# Patient Record
Sex: Female | Born: 1935 | Race: Black or African American | Hispanic: No | Smoking: Never smoker
Health system: Southern US, Community
[De-identification: ages and names within clinical notes are randomized; demographics above are authoritative.]

## PROBLEM LIST (undated history)

## (undated) DIAGNOSIS — H9190 Unspecified hearing loss, unspecified ear: Secondary | ICD-10-CM

## (undated) DIAGNOSIS — I10 Essential (primary) hypertension: Secondary | ICD-10-CM

## (undated) DIAGNOSIS — E785 Hyperlipidemia, unspecified: Secondary | ICD-10-CM

## (undated) DIAGNOSIS — E119 Type 2 diabetes mellitus without complications: Secondary | ICD-10-CM

## (undated) DIAGNOSIS — F0390 Unspecified dementia without behavioral disturbance: Secondary | ICD-10-CM

## (undated) DIAGNOSIS — M889 Osteitis deformans of unspecified bone: Secondary | ICD-10-CM

## (undated) DIAGNOSIS — I517 Cardiomegaly: Secondary | ICD-10-CM

## (undated) DIAGNOSIS — C50412 Malignant neoplasm of upper-outer quadrant of left female breast: Secondary | ICD-10-CM

## (undated) DIAGNOSIS — K219 Gastro-esophageal reflux disease without esophagitis: Secondary | ICD-10-CM

## (undated) DIAGNOSIS — N189 Chronic kidney disease, unspecified: Secondary | ICD-10-CM

## (undated) DIAGNOSIS — M1990 Unspecified osteoarthritis, unspecified site: Secondary | ICD-10-CM

## (undated) DIAGNOSIS — E079 Disorder of thyroid, unspecified: Secondary | ICD-10-CM

## (undated) HISTORY — DX: Unspecified dementia, unspecified severity, without behavioral disturbance, psychotic disturbance, mood disturbance, and anxiety: F03.90

## (undated) HISTORY — DX: Malignant neoplasm of upper-outer quadrant of left female breast: C50.412

## (undated) HISTORY — DX: Osteitis deformans of unspecified bone: M88.9

## (undated) HISTORY — DX: Gastro-esophageal reflux disease without esophagitis: K21.9

## (undated) HISTORY — DX: Essential (primary) hypertension: I10

## (undated) HISTORY — DX: Unspecified osteoarthritis, unspecified site: M19.90

## (undated) HISTORY — PX: THYROID SURGERY: SHX805

## (undated) HISTORY — DX: Cardiomegaly: I51.7

## (undated) HISTORY — DX: Hyperlipidemia, unspecified: E78.5

## (undated) HISTORY — DX: Unspecified hearing loss, unspecified ear: H91.90

## (undated) HISTORY — DX: Type 2 diabetes mellitus without complications: E11.9

## (undated) HISTORY — DX: Chronic kidney disease, unspecified: N18.9

## (undated) HISTORY — DX: Disorder of thyroid, unspecified: E07.9

## (undated) HISTORY — PX: HERNIA REPAIR: SHX51

---

## 2004-02-02 ENCOUNTER — Ambulatory Visit: Payer: Self-pay | Admitting: Gastroenterology

## 2004-03-05 ENCOUNTER — Ambulatory Visit: Payer: Self-pay | Admitting: Internal Medicine

## 2005-05-20 ENCOUNTER — Ambulatory Visit: Payer: Self-pay | Admitting: Internal Medicine

## 2005-11-06 ENCOUNTER — Ambulatory Visit: Payer: Self-pay | Admitting: Gastroenterology

## 2005-11-13 ENCOUNTER — Ambulatory Visit: Payer: Self-pay | Admitting: Gastroenterology

## 2005-12-03 ENCOUNTER — Other Ambulatory Visit: Payer: Self-pay

## 2005-12-03 ENCOUNTER — Ambulatory Visit: Payer: Self-pay | Admitting: Surgery

## 2005-12-10 ENCOUNTER — Ambulatory Visit: Payer: Self-pay | Admitting: Surgery

## 2006-03-06 ENCOUNTER — Ambulatory Visit: Payer: Self-pay | Admitting: Surgery

## 2006-05-21 ENCOUNTER — Ambulatory Visit: Payer: Self-pay | Admitting: Internal Medicine

## 2007-07-30 ENCOUNTER — Ambulatory Visit: Payer: Self-pay | Admitting: Internal Medicine

## 2007-11-17 ENCOUNTER — Ambulatory Visit: Payer: Self-pay | Admitting: Internal Medicine

## 2008-02-29 ENCOUNTER — Ambulatory Visit: Payer: Self-pay | Admitting: Unknown Physician Specialty

## 2008-08-12 ENCOUNTER — Ambulatory Visit: Payer: Self-pay | Admitting: Internal Medicine

## 2008-11-15 ENCOUNTER — Ambulatory Visit: Payer: Self-pay | Admitting: Internal Medicine

## 2009-02-13 ENCOUNTER — Ambulatory Visit: Payer: Self-pay | Admitting: Gastroenterology

## 2009-08-16 ENCOUNTER — Ambulatory Visit: Payer: Self-pay | Admitting: Internal Medicine

## 2010-09-12 ENCOUNTER — Ambulatory Visit: Payer: Self-pay | Admitting: Internal Medicine

## 2011-09-13 ENCOUNTER — Ambulatory Visit: Payer: Self-pay | Admitting: Internal Medicine

## 2012-06-08 ENCOUNTER — Ambulatory Visit: Payer: Self-pay | Admitting: Gastroenterology

## 2012-06-09 LAB — PATHOLOGY REPORT

## 2012-09-15 ENCOUNTER — Ambulatory Visit: Payer: Self-pay | Admitting: Internal Medicine

## 2013-09-19 DIAGNOSIS — M889 Osteitis deformans of unspecified bone: Secondary | ICD-10-CM | POA: Insufficient documentation

## 2013-09-19 DIAGNOSIS — E1149 Type 2 diabetes mellitus with other diabetic neurological complication: Secondary | ICD-10-CM | POA: Insufficient documentation

## 2013-09-19 DIAGNOSIS — E039 Hypothyroidism, unspecified: Secondary | ICD-10-CM | POA: Insufficient documentation

## 2013-09-19 DIAGNOSIS — K219 Gastro-esophageal reflux disease without esophagitis: Secondary | ICD-10-CM | POA: Insufficient documentation

## 2013-09-19 DIAGNOSIS — I119 Hypertensive heart disease without heart failure: Secondary | ICD-10-CM | POA: Insufficient documentation

## 2013-09-19 DIAGNOSIS — I10 Essential (primary) hypertension: Secondary | ICD-10-CM | POA: Insufficient documentation

## 2013-10-04 ENCOUNTER — Ambulatory Visit: Payer: Self-pay | Admitting: Internal Medicine

## 2014-04-12 DIAGNOSIS — C50412 Malignant neoplasm of upper-outer quadrant of left female breast: Secondary | ICD-10-CM

## 2014-04-12 HISTORY — DX: Malignant neoplasm of upper-outer quadrant of left female breast: C50.412

## 2014-04-29 DIAGNOSIS — E7800 Pure hypercholesterolemia, unspecified: Secondary | ICD-10-CM | POA: Insufficient documentation

## 2014-05-03 ENCOUNTER — Emergency Department: Payer: Self-pay | Admitting: Emergency Medicine

## 2014-05-03 ENCOUNTER — Encounter: Payer: Self-pay | Admitting: General Surgery

## 2014-05-09 ENCOUNTER — Other Ambulatory Visit: Payer: Medicare Other

## 2014-05-09 ENCOUNTER — Encounter: Payer: Self-pay | Admitting: General Surgery

## 2014-05-09 ENCOUNTER — Ambulatory Visit (INDEPENDENT_AMBULATORY_CARE_PROVIDER_SITE_OTHER): Payer: Medicare Other | Admitting: General Surgery

## 2014-05-09 VITALS — BP 145/70 | HR 82 | Resp 14 | Ht 64.0 in | Wt 129.0 lb

## 2014-05-09 DIAGNOSIS — N63 Unspecified lump in breast: Secondary | ICD-10-CM

## 2014-05-09 DIAGNOSIS — N6320 Unspecified lump in the left breast, unspecified quadrant: Secondary | ICD-10-CM | POA: Insufficient documentation

## 2014-05-09 NOTE — Progress Notes (Signed)
Patient ID: Tamara Simon, female   DOB: 1924-10-17, 81 y.o.   MRN: [id number]  Chief Complaint  Patient presents with  . Other    left breast mass    HPI Tamara Simon is a 81 y.o. female here for a mass in her left breast. This was noted in the last month. No change Discovery. No history of trauma. No discomfort. Her last mammogram was 09/25/13, Cat 1. She felt the mass while doing her monthly breast exams. She reports no pain, discomfort, or discharge from the breast.  HPI  Past Medical History  Diagnosis Date  . Diabetes mellitus without complication   . Thyroid disease   . GERD (gastroesophageal reflux disease)   . Arthritis   . Hearing loss   . Chronic kidney disease   . Hypertension   . Left ventricular hypertrophy   . Hyperlipidemia   . Paget's bone disease     Past Surgical History  Procedure Laterality Date  . Hernia repair    . Thyroid surgery  81 years old    Family History  Problem Relation Age of Onset  . Breast cancer Sister     Social History History  Substance Use Topics  . Smoking status: Never Smoker   . Smokeless tobacco: Never Used  . Alcohol Use: 0.0 oz/week    0 Standard drinks or equivalent per week    No Known Allergies  Current Outpatient Prescriptions  Medication Sig Dispense Refill  . amLODipine (NORVASC) 5 MG tablet Take 5 mg by mouth daily.    Marland Kitchen aspirin 81 MG tablet Take 81 mg by mouth daily.    . calcium carbonate (OS-CAL) 600 MG TABS tablet Take 600 mg by mouth 2 (two) times daily with a meal.    . glimepiride (AMARYL) 1 MG tablet Take 1 mg by mouth daily with breakfast.    . labetalol (NORMODYNE) 300 MG tablet Take 300 mg by mouth daily.    Marland Kitchen losartan (COZAAR) 100 MG tablet Take 100 mg by mouth daily.    . memantine (NAMENDA TITRATION PACK) tablet pack Take 10 mg by mouth See admin instructions. 5 mg/day for =1 week; 5 mg twice daily for =1 week; 15 mg/day given in 5 mg and 10 mg separated doses for =1 week; then 10 mg twice  daily    . omeprazole (PRILOSEC) 20 MG capsule Take 20 mg by mouth daily.    . potassium chloride SA (K-DUR,KLOR-CON) 20 MEQ tablet Take 20 mEq by mouth 2 (two) times daily.    Marland Kitchen torsemide (DEMADEX) 20 MG tablet Take 20 mg by mouth daily.     No current facility-administered medications for this visit.    Review of Systems Review of Systems  Constitutional: Negative.   Respiratory: Negative.   Cardiovascular: Negative.     Blood pressure 145/70, pulse 82, resp. rate 14, height 5\' 4"  (1.626 m), weight 129 lb (58.514 kg).  Physical Exam Physical Exam  Constitutional: She is oriented to person, place, and time. She appears well-developed and well-nourished.  Eyes: Conjunctivae are normal. No scleral icterus.  Neck: Neck supple.  Cardiovascular: Normal rate, regular rhythm and normal heart sounds.   Pulmonary/Chest: Effort normal and breath sounds normal. Right breast exhibits no inverted nipple, no mass, no nipple discharge, no skin change and no tenderness. Left breast exhibits mass (2 o'clock 10cm from the nipple a 2 cm mass). Left breast exhibits no inverted nipple, no nipple discharge, no skin change and no tenderness.  Lymphadenopathy:    She has no cervical adenopathy.    She has no axillary adenopathy.  Neurological: She is alert and oriented to person, place, and time.  Skin: Skin is warm and dry.    Data Reviewed Screening mammograms dated 10/04/2013 showed fatty replaced breast. BI-RADS-1.  PCP notes dated 04/29/2014 were reviewed.  Laboratory studies at that time showed an creatinine of 1.2 with an estimated GFR 52. Normal electrolytes. (Stage III kidney disease).  Ultrasound examination of the mass showed an irregularly marginated hypoechoic mass with some posterior acoustic enhancement measuring 1.66 x 1.72 x 1.8 cm. This did not extend to the overlying dermis.BI-RADS-4.  The patient was amenable to core biopsy. Alcohol was applied to the breasts followed by 10  mL of 0.5% Xylocaine with 0.25% Marcaine with 1-200,000 epinephrine. ChloraPrep was applied to the skin and a 14-gauge Bard biopsy device was used to take samples through multiple areas within the mass. No bleeding noted. Years were suggestive of tissue rather than abscess. The skin defect was closed with benzoin and Steri-Strips followed by a Telfa and Tegaderm dressing.  The procedure was well tolerated.    Assessment    Left breast mass.     Plan    Post biopsy instructions were provided. The patient will be contacted when biopsy results have returned.     Ref/PCP Dr Jerilynn Mages. Cephas Darby, Forest Gleason 05/09/2014, 10:38 PM

## 2014-05-09 NOTE — Patient Instructions (Addendum)
CARE AFTER BREAST BIOPSY  1. Leave the dressing on that your doctor applied after surgery. It is waterproof. You may bathe, shower and/or swim. The dressing will probably remain intact until your return office visit. If the dressing comes off, you will see small strips of tape against your skin on the incision. Do not remove these strips.  2. You may want to use a gauze,cloth or similar protection in your bra to prevent rubbing against your dressing and incision. This is not necessary, but you may feel more comfortable doing so.  3. It is recommended that you wear a bra day and night to give support to the breast. This will prevent the weight of the breast from pulling on the incision.  4. Your breast will feel hard and lumpy under the incision. Do not be alarmed. This is the underlying stitching of tissue. Softening of this tissue will occur in time.  5. Make sure you call the office and schedule an appointment in one week after your surgery. The office phone number is [phone]. [facility] may have already done this for you.  6. You will notice about a week after your office visit that the strips of the tape on your incision will begin to loosen. These may then be removed.  7. Report to your doctor any of the following:  * Severe pain not relieved by your pain medication  *Redness of the incision  * Drainage from the incision  *Fever greater than 101 degrees  Follow up in one week

## 2014-05-11 ENCOUNTER — Telehealth: Payer: Self-pay | Admitting: General Surgery

## 2014-05-11 NOTE — Telephone Encounter (Signed)
Patient asked to call back for biopsy results.

## 2014-05-16 ENCOUNTER — Ambulatory Visit (INDEPENDENT_AMBULATORY_CARE_PROVIDER_SITE_OTHER): Payer: Medicare Other | Admitting: *Deleted

## 2014-05-16 DIAGNOSIS — N63 Unspecified lump in breast: Secondary | ICD-10-CM

## 2014-05-16 DIAGNOSIS — N6320 Unspecified lump in the left breast, unspecified quadrant: Secondary | ICD-10-CM

## 2014-05-16 NOTE — Progress Notes (Signed)
Patient here today for follow up post left  breast biopsy. Steristrip in place and aware it may come off in one week.  Minimal bruising noted.  The patient is aware that a heating pad may be used for comfort as needed.  Aware of pathology. Patient to return 05/17/14 to discuss options with MD.

## 2014-05-17 ENCOUNTER — Ambulatory Visit (INDEPENDENT_AMBULATORY_CARE_PROVIDER_SITE_OTHER): Payer: Medicare Other | Admitting: General Surgery

## 2014-05-17 ENCOUNTER — Encounter: Payer: Self-pay | Admitting: General Surgery

## 2014-05-17 VITALS — BP 130/70 | HR 72 | Resp 12 | Ht 64.0 in | Wt 124.0 lb

## 2014-05-17 DIAGNOSIS — N63 Unspecified lump in breast: Secondary | ICD-10-CM | POA: Diagnosis not present

## 2014-05-17 DIAGNOSIS — C50412 Malignant neoplasm of upper-outer quadrant of left female breast: Secondary | ICD-10-CM | POA: Diagnosis not present

## 2014-05-17 DIAGNOSIS — N6320 Unspecified lump in the left breast, unspecified quadrant: Secondary | ICD-10-CM

## 2014-05-17 NOTE — Progress Notes (Signed)
Patient ID: Tamara Simon, female   DOB: Nov 28, 1924, 81 y.o.   MRN: [id number]  Chief Complaint  Patient presents with  . Follow-up    discuss biopsy results    HPI Tamara Simon is a 81 y.o. female here today to discuss biopsy results. She is here today with her niece, Sherian Rein. Her son, Francely Craw, participated by phone. HPI  Past Medical History  Diagnosis Date  . Diabetes mellitus without complication   . Thyroid disease   . GERD (gastroesophageal reflux disease)   . Arthritis   . Hearing loss   . Chronic kidney disease   . Hypertension   . Left ventricular hypertrophy   . Hyperlipidemia   . Paget's bone disease     Past Surgical History  Procedure Laterality Date  . Hernia repair    . Thyroid surgery  81 years old    Family History  Problem Relation Age of Onset  . Breast cancer Sister     Social History History  Substance Use Topics  . Smoking status: Never Smoker   . Smokeless tobacco: Never Used  . Alcohol Use: 0.0 oz/week    0 Standard drinks or equivalent per week    No Known Allergies  Current Outpatient Prescriptions  Medication Sig Dispense Refill  . amLODipine (NORVASC) 5 MG tablet Take 5 mg by mouth daily.    Marland Kitchen aspirin 81 MG tablet Take 81 mg by mouth daily.    . calcium carbonate (OS-CAL) 600 MG TABS tablet Take 600 mg by mouth 2 (two) times daily with a meal.    . glimepiride (AMARYL) 1 MG tablet Take 1 mg by mouth daily with breakfast.    . labetalol (NORMODYNE) 300 MG tablet Take 300 mg by mouth daily.    Marland Kitchen losartan (COZAAR) 100 MG tablet Take 100 mg by mouth daily.    . memantine (NAMENDA TITRATION PACK) tablet pack Take 10 mg by mouth See admin instructions. 5 mg/day for =1 week; 5 mg twice daily for =1 week; 15 mg/day given in 5 mg and 10 mg separated doses for =1 week; then 10 mg twice daily    . omeprazole (PRILOSEC) 20 MG capsule Take 20 mg by mouth daily.    . potassium chloride SA (K-DUR,KLOR-CON) 20 MEQ tablet Take 20  mEq by mouth 2 (two) times daily.    Marland Kitchen torsemide (DEMADEX) 20 MG tablet Take 20 mg by mouth daily.     No current facility-administered medications for this visit.    Review of Systems Review of Systems  Constitutional: Negative.   Respiratory: Negative.   Cardiovascular: Negative.     Blood pressure 130/70, pulse 72, resp. rate 12, height 5\' 4"  (1.626 m), weight 124 lb (56.246 kg).  Physical Exam Physical Exam CMA reports minimal bruising at the biopsy site.  Data Reviewed Triple negative left breast cancer.  Assessment    Recently noted left breast cancer, previously negative mammograms August 2015.    Plan     Options for management of the cancer were reviewed with the patient, her niece and her son (by phone). This fairly sizable mass developed relatively quickly, and with a triple negative histology has an unfavorable prognostic course. The patient's sister had breast cancer over 15 years ago and did well after mastectomy. Breast conservation with radiation as well as mastectomy were presented as equivalent procedures. There is no clinical evidence of metastatic disease to the axilla. The patient was amenable to having laboratory studies  obtained.  She'll consider her options for management notify the office of how she would like to proceed.  Robert Bellow 05/18/2014, 7:45 AM

## 2014-05-18 ENCOUNTER — Encounter: Payer: Self-pay | Admitting: General Surgery

## 2014-05-18 ENCOUNTER — Telehealth: Payer: Self-pay | Admitting: *Deleted

## 2014-05-18 DIAGNOSIS — C50412 Malignant neoplasm of upper-outer quadrant of left female breast: Secondary | ICD-10-CM | POA: Insufficient documentation

## 2014-05-18 LAB — CBC WITH DIFFERENTIAL/PLATELET
BASOS ABS: 0.1 10*3/uL (ref 0.0–0.2)
Basos: 1 %
Eos: 2 %
Eosinophils Absolute: 0.1 10*3/uL (ref 0.0–0.4)
HCT: 33.7 % — ABNORMAL LOW (ref 34.0–46.6)
Hemoglobin: 11.4 g/dL (ref 11.1–15.9)
IMMATURE GRANULOCYTES: 0 %
Immature Grans (Abs): 0 10*3/uL (ref 0.0–0.1)
LYMPHS: 21 %
Lymphocytes Absolute: 1 10*3/uL (ref 0.7–3.1)
MCH: 27.8 pg (ref 26.6–33.0)
MCHC: 33.8 g/dL (ref 31.5–35.7)
MCV: 82 fL (ref 79–97)
MONOCYTES: 6 %
Monocytes Absolute: 0.3 10*3/uL (ref 0.1–0.9)
Neutrophils Absolute: 3.5 10*3/uL (ref 1.4–7.0)
Neutrophils Relative %: 70 %
PLATELETS: 297 10*3/uL (ref 150–379)
RBC: 4.1 x10E6/uL (ref 3.77–5.28)
RDW: 13.4 % (ref 12.3–15.4)
WBC: 5 10*3/uL (ref 3.4–10.8)

## 2014-05-18 LAB — CANCER ANTIGEN 27.29: CA 27.29: 28.3 U/mL (ref 0.0–38.6)

## 2014-05-18 LAB — CEA: CEA: 3.3 ng/mL (ref 0.0–4.7)

## 2014-05-18 NOTE — Telephone Encounter (Signed)
Notified patient as instructed, patient pleased. Discussed follow-up appointments will be pending the status of her sister (end of life care), patient agrees.

## 2014-05-18 NOTE — Telephone Encounter (Signed)
-----   Message from Robert Bellow, MD sent at 05/18/2014  8:08 AM EDT ----- Please notify the patient her blood work was normal. See if she has any questions about decisions regarding management of her breast cancer. Thank you ----- Message -----    From: [facility] Lab Results In Interface    Sent: 05/18/2014   7:44 AM      To: Robert Bellow, MD

## 2014-05-25 ENCOUNTER — Ambulatory Visit (INDEPENDENT_AMBULATORY_CARE_PROVIDER_SITE_OTHER): Payer: Medicare Other | Admitting: General Surgery

## 2014-05-25 ENCOUNTER — Encounter: Payer: Self-pay | Admitting: General Surgery

## 2014-05-25 VITALS — BP 134/68 | HR 62 | Resp 12 | Ht 64.0 in | Wt 126.0 lb

## 2014-05-25 DIAGNOSIS — C50412 Malignant neoplasm of upper-outer quadrant of left female breast: Secondary | ICD-10-CM | POA: Diagnosis not present

## 2014-05-25 NOTE — Patient Instructions (Addendum)
The patient is aware to call back for any questions or concerns.  Patient to notify our office when she wishes to proceed with left mastectomy.

## 2014-05-25 NOTE — Progress Notes (Signed)
Patient ID: Tamara Simon, female   DOB: 10-22-1924, 81 y.o.   MRN: [id number]  Chief Complaint  Patient presents with  . Follow-up    discussion    HPI Tamara Simon is a 81 y.o. female.  Here today for discussion with her son, Tamara Simon.  Mr. Coltrain had been on a phone interview with the patient and the patient's niece last week to review options for management of her mother's recently noted breast cancer. He came in from [place], [place] for in person interview and to help assist his mother with her decision making.   HPI  Past Medical History  Diagnosis Date  . Diabetes mellitus without complication   . Thyroid disease   . GERD (gastroesophageal reflux disease)   . Arthritis   . Hearing loss   . Chronic kidney disease   . Hypertension   . Left ventricular hypertrophy   . Hyperlipidemia   . Paget's bone disease   . Breast cancer of upper-outer quadrant of left female breast March 2016    Triple negative    Past Surgical History  Procedure Laterality Date  . Hernia repair    . Thyroid surgery  81 years old    Family History  Problem Relation Age of Onset  . Breast cancer Sister     Social History History  Substance Use Topics  . Smoking status: Never Smoker   . Smokeless tobacco: Never Used  . Alcohol Use: 0.0 oz/week    0 Standard drinks or equivalent per week    No Known Allergies  Current Outpatient Prescriptions  Medication Sig Dispense Refill  . amLODipine (NORVASC) 5 MG tablet Take 5 mg by mouth daily.    Marland Kitchen aspirin 81 MG tablet Take 81 mg by mouth daily.    . calcium carbonate (OS-CAL) 600 MG TABS tablet Take 600 mg by mouth 2 (two) times daily with a meal.    . glimepiride (AMARYL) 1 MG tablet Take 1 mg by mouth daily with breakfast.    . labetalol (NORMODYNE) 300 MG tablet Take 300 mg by mouth daily.    Marland Kitchen losartan (COZAAR) 100 MG tablet Take 100 mg by mouth daily.    . memantine (NAMENDA TITRATION PACK) tablet pack Take 10 mg by mouth  See admin instructions. 5 mg/day for =1 week; 5 mg twice daily for =1 week; 15 mg/day given in 5 mg and 10 mg separated doses for =1 week; then 10 mg twice daily    . omeprazole (PRILOSEC) 20 MG capsule Take 20 mg by mouth daily.    . potassium chloride SA (K-DUR,KLOR-CON) 20 MEQ tablet Take 20 mEq by mouth 2 (two) times daily.    Marland Kitchen torsemide (DEMADEX) 20 MG tablet Take 20 mg by mouth daily.     No current facility-administered medications for this visit.    Review of Systems Review of Systems  Blood pressure 134/68, pulse 62, resp. rate 12, height 5\' 4"  (1.626 m), weight 126 lb (57.153 kg).  Physical Exam Physical Exam  Pulmonary/Chest:  Biopsy site healing well, mild irritation from dressing.    Data Reviewed Triple negative cancer of the left breast. Negative mammogram 6 months ago.  Assessment    Left breast cancer     Plan    I think the patient will be well served by surgical removal of the tumor. We discussed the pros and cons of breast conservation followed by radiation or mastectomy. She really seemed to have no interest in  breast conservation. Reconstruction was not offered based on her age and the prolonged anesthesia required to complete this.  Her general health is good, and her sister lived almost 41 years after her own mastectomy. This sister recently passed after episode of what sounds like pneumonia complicated by multisystem organ failure. This may have been impairing the patient's ability to make a clear decision regarding her own care.  Patient to notify our office when she wishes to proceed with left mastectomy.     PCP:  Irish Elders 05/28/2014, 11:04 AM

## 2014-07-08 HISTORY — PX: OTHER SURGICAL HISTORY: SHX169

## 2015-05-01 ENCOUNTER — Encounter: Payer: Self-pay | Admitting: *Deleted

## 2016-02-01 ENCOUNTER — Other Ambulatory Visit: Payer: Self-pay | Admitting: Internal Medicine

## 2016-02-01 DIAGNOSIS — E1140 Type 2 diabetes mellitus with diabetic neuropathy, unspecified: Secondary | ICD-10-CM

## 2016-02-01 DIAGNOSIS — E039 Hypothyroidism, unspecified: Secondary | ICD-10-CM

## 2016-02-01 DIAGNOSIS — I10 Essential (primary) hypertension: Secondary | ICD-10-CM

## 2016-02-01 DIAGNOSIS — R634 Abnormal weight loss: Secondary | ICD-10-CM

## 2016-02-15 ENCOUNTER — Ambulatory Visit: Admission: RE | Admit: 2016-02-15 | Payer: Self-pay | Source: Ambulatory Visit

## 2016-02-20 ENCOUNTER — Ambulatory Visit: Payer: Self-pay

## 2016-02-26 ENCOUNTER — Ambulatory Visit: Payer: Self-pay | Admitting: Oncology

## 2016-02-27 ENCOUNTER — Ambulatory Visit
Admission: RE | Admit: 2016-02-27 | Discharge: 2016-02-27 | Disposition: A | Discharge status: Home / Self Care | Payer: Medicare Other | Source: Ambulatory Visit | Attending: Internal Medicine | Admitting: Internal Medicine

## 2016-02-27 DIAGNOSIS — Z9049 Acquired absence of other specified parts of digestive tract: Secondary | ICD-10-CM | POA: Diagnosis not present

## 2016-02-27 DIAGNOSIS — E1140 Type 2 diabetes mellitus with diabetic neuropathy, unspecified: Secondary | ICD-10-CM | POA: Diagnosis present

## 2016-02-27 DIAGNOSIS — I2510 Atherosclerotic heart disease of native coronary artery without angina pectoris: Secondary | ICD-10-CM | POA: Insufficient documentation

## 2016-02-27 DIAGNOSIS — M88851 Osteitis deformans of right thigh: Secondary | ICD-10-CM | POA: Diagnosis not present

## 2016-02-27 DIAGNOSIS — I70298 Other atherosclerosis of native arteries of extremities, other extremity: Secondary | ICD-10-CM | POA: Diagnosis not present

## 2016-02-27 DIAGNOSIS — I10 Essential (primary) hypertension: Secondary | ICD-10-CM | POA: Insufficient documentation

## 2016-02-27 DIAGNOSIS — M47896 Other spondylosis, lumbar region: Secondary | ICD-10-CM | POA: Insufficient documentation

## 2016-02-27 DIAGNOSIS — R634 Abnormal weight loss: Secondary | ICD-10-CM | POA: Diagnosis not present

## 2016-02-27 DIAGNOSIS — E039 Hypothyroidism, unspecified: Secondary | ICD-10-CM | POA: Diagnosis present

## 2016-02-27 DIAGNOSIS — N281 Cyst of kidney, acquired: Secondary | ICD-10-CM | POA: Diagnosis not present

## 2016-02-27 DIAGNOSIS — K7689 Other specified diseases of liver: Secondary | ICD-10-CM | POA: Insufficient documentation

## 2016-02-27 MED ORDER — IOPAMIDOL (ISOVUE-300) INJECTION 61%
80.0000 mL | Freq: Once | INTRAVENOUS | Status: AC | PRN
  Administered 2016-02-27: 80 mL via INTRAVENOUS

## 2016-03-19 NOTE — Progress Notes (Addendum)
Hematology/On[facility] Telephone:([phone] [phone]  Patient Care Team: Kirk Ruths, MD as PCP - General (Internal Medicine) Kirk Ruths, MD (Internal Medicine) Robert Bellow, MD (General Surgery)   Name of the patient: Tamara Simon  [id number]  1924-06-27    Reason for referral- h/o breast cancer   Referring physician- Dr. Ginette Pitman  Date of visit: 03/19/16   History of presenting illness- patient is a 83 year old female who was diagnosed with triple negative left breast cancer in March 2016. Her oncology history is as follows:  04/2014: She self-palpated a left breast mass while bathing.  05/09/14: Ultrasound examination of left breast mass showed an irregularly marginated hypoechoic mass with some posterior acoustic enhancement measuring 1.66 x 1.72 x 1.80 cm. This did not extend to the overlying dermis. BI-RADS-4.   Left breast core biopsy of this mass confirmed grade 3 invasive ductal carcinoma, measuring 0.7 cm, with no DCIS. Receptor status ER/PR/HER2 negative.  06/16/14: Additional imaging at [facility] showed no abnormalities in the contralateral right breast, and no left axillary lymphadenopathy. The index lesion measured 2.5 x 3 cm in the left breast upper outer quadrant.  07/08/14: Left partial mastectomy: 2.5 cm, grade 3, LVI positive, lymph node not evaluated, clear margin invasive ductal carcinoma.  09/14/14: Completed adjuvant radiation therapy (Canadian fractionation plus electron boost), 4272 cGy + 1000 cGy   Patient did not get any adjuvant chemotherapy due to her age anf frailty   Patient is here with her niece today who is very involved in her care. She has dementia and cannot explain things in detail. She denies any pain or discomfort today  ECOG PS- 2  Pain scale- 0   Review of systems- Review of Systems  Constitutional: Negative for chills, fever, malaise/fatigue and weight loss.    HENT: Negative for congestion, ear discharge and nosebleeds.   Eyes: Negative for blurred vision.  Respiratory: Negative for cough, hemoptysis, sputum production, shortness of breath and wheezing.   Cardiovascular: Negative for chest pain, palpitations, orthopnea and claudication.  Gastrointestinal: Negative for abdominal pain, blood in stool, constipation, diarrhea, heartburn, melena, nausea and vomiting.  Genitourinary: Negative for dysuria, flank pain, frequency, hematuria and urgency.  Musculoskeletal: Negative for back pain, joint pain and myalgias.  Skin: Negative for rash.  Neurological: Negative for dizziness, tingling, focal weakness, seizures, weakness and headaches.  Endo/Heme/Allergies: Does not bruise/bleed easily.  Psychiatric/Behavioral: Negative for depression and suicidal ideas. The patient does not have insomnia.     No Known Allergies  Patient Active Problem List   Diagnosis Date Noted  . Breast cancer of upper-outer quadrant of left female breast ([place]) 05/18/2014     Past Medical History:  Diagnosis Date  . Arthritis   . Breast cancer of upper-outer quadrant of left female breast [facility]) March 2016   Triple negative  . Chronic kidney disease   . Dementia   . Diabetes mellitus without complication ([place])   . GERD (gastroesophageal reflux disease)   . Hearing loss   . Hyperlipidemia   . Hypertension   . Left ventricular hypertrophy   . Paget's bone disease   . Thyroid disease      Past Surgical History:  Procedure Laterality Date  . HERNIA REPAIR    . partial mastectomy left breast  07/08/2014  . THYROID SURGERY  83 years old    Social History   Social History  . Marital status: Widowed    Spouse name: N/A  .  Number of children: N/A  . Years of education: N/A   Occupational History  . Not on file.   Social History Main Topics  . Smoking status: Never Smoker  . Smokeless tobacco: Never Used  . Alcohol use 0.0 oz/week  . Drug use: No  .  Sexual activity: Not on file   Other Topics Concern  . Not on file   Social History Narrative  . No narrative on file     Family History  Problem Relation Age of Onset  . Breast cancer Sister      Current Outpatient Prescriptions:  .  aspirin 81 MG tablet, Take 81 mg by mouth daily., Disp: , Rfl:  .  calcium carbonate (OS-CAL) 600 MG TABS tablet, Take 600 mg by mouth 2 (two) times daily with a meal., Disp: , Rfl:  .  docusate sodium (COLACE) 100 MG capsule, Take by mouth., Disp: , Rfl:  .  glimepiride (AMARYL) 1 MG tablet, Take 1 mg by mouth daily with breakfast., Disp: , Rfl:  .  iron polysaccharides (NIFEREX) 150 MG capsule, Take by mouth., Disp: , Rfl:  .  labetalol (NORMODYNE) 300 MG tablet, Take 300 mg by mouth daily., Disp: , Rfl:  .  levothyroxine (SYNTHROID, LEVOTHROID) 125 MCG tablet, Take by mouth., Disp: , Rfl:  .  losartan (COZAAR) 25 MG tablet, Take by mouth., Disp: , Rfl:  .  memantine (NAMENDA TITRATION PACK) tablet pack, Take 10 mg by mouth See admin instructions. 5 mg/day for =1 week; 5 mg twice daily for =1 week; 15 mg/day given in 5 mg and 10 mg separated doses for =1 week; then 10 mg twice daily, Disp: , Rfl:  .  potassium chloride SA (K-DUR,KLOR-CON) 20 MEQ tablet, Take 20 mEq by mouth 2 (two) times daily., Disp: , Rfl:  .  torsemide (DEMADEX) 20 MG tablet, Take 20 mg by mouth daily., Disp: , Rfl:  .  amLODipine (NORVASC) 5 MG tablet, Take 5 mg by mouth daily., Disp: , Rfl:  .  omeprazole (PRILOSEC) 20 MG capsule, Take 20 mg by mouth daily., Disp: , Rfl:  .  traMADol (ULTRAM) 50 MG tablet, Take 50 mg by mouth., Disp: , Rfl:    Physical exam:  Vitals:   03/21/16 1042  BP: (!) 193/64  Pulse: 81  Resp: 18  Temp: (!) 96.5 F (35.8 C)  TempSrc: Tympanic  Weight: 120 lb 9.5 oz (54.7 kg)  Height: 5' 1.81" (1.57 m)   Physical Exam  Constitutional: She is oriented to person, place, and time and well-developed, well-nourished, and in no distress.  HENT:    Head: Normocephalic and atraumatic.  Eyes: EOM are normal. Pupils are equal, round, and reactive to light.  Neck: Normal range of motion.  Cardiovascular: Normal rate, regular rhythm and normal heart sounds.   Pulmonary/Chest: Effort normal and breath sounds normal.  Abdominal: Soft. Bowel sounds are normal.  Neurological: She is alert and oriented to person, place, and time.  Skin: Skin is warm and dry.   Breast exam was performed in seated and lying down position. Patient is status post left lumpectomy with a well-healed surgical scar. No evidence of any palpable masses. No evidence of axillary adenopathy. No evidence of any palpable masses or lumps in the right breast. No evidence of right axillary adenopathy   No flowsheet data found. CBC Latest Ref Rng & Units 05/17/2014  WBC 3.4 - 10.8 x10E3/uL 5.0  Hemoglobin 11.1 - 15.9 g/dL 11.4  Hematocrit 34.0 -  46.6 % 33.7(L)  Platelets 150 - 379 x10E3/uL 297    No images are attached to the encounter.  Ct Abdomen Pelvis W Contrast  Result Date: 02/27/2016 CLINICAL DATA:  Chronic lower abdominal discomfort. EXAM: CT ABDOMEN AND PELVIS WITH CONTRAST TECHNIQUE: Multidetector CT imaging of the abdomen and pelvis was performed using the standard protocol following bolus administration of intravenous contrast. CONTRAST:  80 ml ISOVUE-300 IOPAMIDOL (ISOVUE-300) INJECTION 61% COMPARISON:  CT abdomen and pelvis 02/29/2008. FINDINGS: Lower chest: Small amount of contrast is seen in the distal esophagus consistent with either poor motility or reflux. Heart size is upper normal. No pericardial or pleural effusion. Lung bases are clear. Hepatobiliary: Status post cholecystectomy. 1 cm cyst in the inferior right hepatic lobe is unchanged. The liver otherwise appears normal. Biliary tree is unremarkable. Pancreas: Unremarkable. No pancreatic ductal dilatation or surrounding inflammatory changes. Spleen: Normal in size without focal abnormality. Adrenals/Urinary  Tract: 2 small right renal cysts are unchanged. The kidneys otherwise appear normal. Urinary bladder is unremarkable. The adrenal glands appear normal. Stomach/Bowel: Prominent colonic stool burden is noted. The colon is otherwise unremarkable. The stomach, small bowel and appendix appear normal. Vascular/Lymphatic: Scattered aortoiliac atherosclerosis without aneurysm is seen. No lymphadenopathy. Reproductive: Uterus and bilateral adnexa are unremarkable. Other: No abdominal wall hernia or abnormality. No abdominopelvic ascites. Musculoskeletal: Cortical thickening in the sacrum and right ilium is unchanged and most consistent with Paget's disease. Lower lumbar spondylosis is noted. IMPRESSION: No acute abnormality or finding to explain patient's symptoms. Small volume of contrast in the distal esophagus could be due to reflux or poor motility. Atherosclerosis. Prominent colonic stool burden. Paget's disease right sacrum and right ilium, stable in appearance. Electronically Signed   By: Inge Rise M.D.   On: 02/27/2016 09:24    Assessment and plan- Patient is a 83 y.o. female with a history of stage II triple negative left breast cancer diagnosed in March 2016 status post lumpectomy and adjuvant radiation. She currently remains on clinical surveillance  1. Patient has dementia and cannot understand the implications of her breast cancer diagnosis and management options. I did explain to the niece that the risk for recurrence for triple negative breast cancer is highest in the 1st 5 years and goes down subsequently. Patient is 83 yrs old and is frail with dementia. Her family needs to decide if they would like to continue pursue screening mammograms at her age understanding that mammograms can pick up early breast lesions but would be associated with further procedures such as biospies and possible surgical interventions. The alternative would be to forego mammograms and continue physical exam and breast  exam every 6 months and consider intervention if palpable mass is noted on breast exam. Patients niece would like to think about this and discuss this further with Med Onc at [facility] and get back to [place]  2. HTN- on repeat check SBP was 188. Patient has not taken her AM BP meds which she will after she goes home. We have also contacted herPCP office and informed them of the findings  I will see her back in 6 months and schedule mammogram if family decides to pursue it   Total face to face encounter time for this patient visit was 30 min. >50% of the time was  spent in counseling and coordination of care.     Thank you for this kind referral and the opportunity to participate in the care of this patient   Visit Diagnosis 1. Malignant neoplasm of  upper-outer quadrant of left breast in female, estrogen receptor negative ([place])   2. Essential hypertension     Dr. Randa Evens, MD, MPH [facility] at [facility] Pager- [phone] 03/19/2016  2:04 PM

## 2016-03-21 ENCOUNTER — Inpatient Hospital Stay: Payer: Medicare Other | Attending: Oncology | Admitting: Oncology

## 2016-03-21 ENCOUNTER — Encounter: Payer: Self-pay | Admitting: Oncology

## 2016-03-21 VITALS — BP 188/66 | HR 66 | Temp 96.5°F | Resp 18 | Ht 61.81 in | Wt 120.6 lb

## 2016-03-21 DIAGNOSIS — Z923 Personal history of irradiation: Secondary | ICD-10-CM | POA: Diagnosis not present

## 2016-03-21 DIAGNOSIS — K219 Gastro-esophageal reflux disease without esophagitis: Secondary | ICD-10-CM | POA: Diagnosis not present

## 2016-03-21 DIAGNOSIS — E079 Disorder of thyroid, unspecified: Secondary | ICD-10-CM | POA: Insufficient documentation

## 2016-03-21 DIAGNOSIS — Z171 Estrogen receptor negative status [ER-]: Secondary | ICD-10-CM | POA: Diagnosis not present

## 2016-03-21 DIAGNOSIS — R54 Age-related physical debility: Secondary | ICD-10-CM | POA: Diagnosis not present

## 2016-03-21 DIAGNOSIS — E1122 Type 2 diabetes mellitus with diabetic chronic kidney disease: Secondary | ICD-10-CM | POA: Diagnosis not present

## 2016-03-21 DIAGNOSIS — C50412 Malignant neoplasm of upper-outer quadrant of left female breast: Secondary | ICD-10-CM

## 2016-03-21 DIAGNOSIS — Z7982 Long term (current) use of aspirin: Secondary | ICD-10-CM | POA: Insufficient documentation

## 2016-03-21 DIAGNOSIS — N189 Chronic kidney disease, unspecified: Secondary | ICD-10-CM | POA: Insufficient documentation

## 2016-03-21 DIAGNOSIS — I129 Hypertensive chronic kidney disease with stage 1 through stage 4 chronic kidney disease, or unspecified chronic kidney disease: Secondary | ICD-10-CM | POA: Insufficient documentation

## 2016-03-21 DIAGNOSIS — Z853 Personal history of malignant neoplasm of breast: Secondary | ICD-10-CM | POA: Diagnosis not present

## 2016-03-21 DIAGNOSIS — F0390 Unspecified dementia without behavioral disturbance: Secondary | ICD-10-CM

## 2016-03-21 DIAGNOSIS — Z7984 Long term (current) use of oral hypoglycemic drugs: Secondary | ICD-10-CM | POA: Insufficient documentation

## 2016-03-21 DIAGNOSIS — E785 Hyperlipidemia, unspecified: Secondary | ICD-10-CM | POA: Insufficient documentation

## 2016-03-21 DIAGNOSIS — Z79899 Other long term (current) drug therapy: Secondary | ICD-10-CM | POA: Diagnosis not present

## 2016-03-21 DIAGNOSIS — I10 Essential (primary) hypertension: Secondary | ICD-10-CM

## 2016-03-21 NOTE — Progress Notes (Signed)
Pt  Here for initial l visit . Transferred form [facility]./  pcp at [place]. Family friend Kevan Ny to verify  Cozaar dosages and needs to pick up med today the patient is [facility], Was dx w Dementia per Jefm Bryant notes x 2 yrs she believes . This Probation officer called pharmacy and per Mickel Baas pharmacist pt is now on Cozaar 25mg  po qd. Med list updated 1136 am. Repeat bp taken-188/64,p-66. Per care giver Su Hilt pt had not taken antihypertensive meds this am -encouraged to do so when they get home-acknowledged understanding Vella Raring RN  1154 am. Call to Dr Ginette Pitman office (pcp) [phone] and message left w initial receptionist who stated she would send this message to Dr Ginette Pitman.

## 2016-04-01 ENCOUNTER — Telehealth: Payer: Self-pay | Admitting: *Deleted

## 2016-07-22 ENCOUNTER — Emergency Department: Payer: Medicare Other

## 2016-07-22 ENCOUNTER — Encounter: Payer: Self-pay | Admitting: Emergency Medicine

## 2016-07-22 ENCOUNTER — Emergency Department
Admission: EM | Admit: 2016-07-22 | Discharge: 2016-07-22 | Disposition: A | Discharge status: Skilled Nursing Facility | Payer: Medicare Other | Attending: Emergency Medicine | Admitting: Emergency Medicine

## 2016-07-22 DIAGNOSIS — Z853 Personal history of malignant neoplasm of breast: Secondary | ICD-10-CM | POA: Diagnosis not present

## 2016-07-22 DIAGNOSIS — N189 Chronic kidney disease, unspecified: Secondary | ICD-10-CM | POA: Diagnosis not present

## 2016-07-22 DIAGNOSIS — Z79899 Other long term (current) drug therapy: Secondary | ICD-10-CM | POA: Insufficient documentation

## 2016-07-22 DIAGNOSIS — Z7984 Long term (current) use of oral hypoglycemic drugs: Secondary | ICD-10-CM | POA: Diagnosis not present

## 2016-07-22 DIAGNOSIS — F0390 Unspecified dementia without behavioral disturbance: Secondary | ICD-10-CM | POA: Insufficient documentation

## 2016-07-22 DIAGNOSIS — Z7982 Long term (current) use of aspirin: Secondary | ICD-10-CM | POA: Insufficient documentation

## 2016-07-22 DIAGNOSIS — E119 Type 2 diabetes mellitus without complications: Secondary | ICD-10-CM | POA: Insufficient documentation

## 2016-07-22 DIAGNOSIS — W19XXXA Unspecified fall, initial encounter: Secondary | ICD-10-CM

## 2016-07-22 DIAGNOSIS — Y92129 Unspecified place in nursing home as the place of occurrence of the external cause: Secondary | ICD-10-CM | POA: Diagnosis not present

## 2016-07-22 DIAGNOSIS — Y998 Other external cause status: Secondary | ICD-10-CM | POA: Insufficient documentation

## 2016-07-22 DIAGNOSIS — T1490XA Injury, unspecified, initial encounter: Secondary | ICD-10-CM | POA: Diagnosis present

## 2016-07-22 DIAGNOSIS — W010XXA Fall on same level from slipping, tripping and stumbling without subsequent striking against object, initial encounter: Secondary | ICD-10-CM | POA: Diagnosis not present

## 2016-07-22 DIAGNOSIS — I129 Hypertensive chronic kidney disease with stage 1 through stage 4 chronic kidney disease, or unspecified chronic kidney disease: Secondary | ICD-10-CM | POA: Insufficient documentation

## 2016-07-22 DIAGNOSIS — Y9389 Activity, other specified: Secondary | ICD-10-CM | POA: Insufficient documentation

## 2016-07-22 LAB — URINALYSIS, COMPLETE (UACMP) WITH MICROSCOPIC
BILIRUBIN URINE: NEGATIVE
Bacteria, UA: NONE SEEN
GLUCOSE, UA: NEGATIVE mg/dL
Ketones, ur: NEGATIVE mg/dL
Leukocytes, UA: NEGATIVE
NITRITE: NEGATIVE
PROTEIN: 30 mg/dL — AB
SPECIFIC GRAVITY, URINE: 1.006 (ref 1.005–1.030)
pH: 6 (ref 5.0–8.0)

## 2016-07-22 LAB — BASIC METABOLIC PANEL
ANION GAP: 7 (ref 5–15)
BUN: 29 mg/dL — ABNORMAL HIGH (ref 6–20)
CALCIUM: 9 mg/dL (ref 8.9–10.3)
CO2: 27 mmol/L (ref 22–32)
Chloride: 103 mmol/L (ref 101–111)
Creatinine, Ser: 1.03 mg/dL — ABNORMAL HIGH (ref 0.44–1.00)
GFR, EST AFRICAN AMERICAN: 54 mL/min — AB (ref >60)
GFR, EST NON AFRICAN AMERICAN: 46 mL/min — AB (ref >60)
GLUCOSE: 103 mg/dL — AB (ref 65–99)
POTASSIUM: 3.4 mmol/L — AB (ref 3.5–5.1)
SODIUM: 137 mmol/L (ref 135–145)

## 2016-07-22 LAB — CBC
HCT: 33.8 % — ABNORMAL LOW (ref 35.0–47.0)
Hemoglobin: 11.5 g/dL — ABNORMAL LOW (ref 12.0–16.0)
MCH: 27.7 pg (ref 26.0–34.0)
MCHC: 34.2 g/dL (ref 32.0–36.0)
MCV: 81.1 fL (ref 80.0–100.0)
Platelets: 178 10*3/uL (ref 150–440)
RBC: 4.16 MIL/uL (ref 3.80–5.20)
RDW: 14.7 % — ABNORMAL HIGH (ref 11.5–14.5)
WBC: 7.8 10*3/uL (ref 3.6–11.0)

## 2016-07-22 LAB — TROPONIN I
TROPONIN I: 0.04 ng/mL — AB (ref <0.03)
Troponin I: 0.03 ng/mL (ref <0.03)

## 2016-07-22 NOTE — ED Notes (Signed)
Pt able to ambulate to restroom with one assist. Pt is steady on feet but reports having become tired after walking for extended amount of time.

## 2016-07-22 NOTE — ED Triage Notes (Signed)
Pt here from [facility], pt had witness fall at 0800. Pt ambulatory after fall. Denies hitting head, denies LOC. EMS reports normal ambulation upon their arrival.

## 2016-07-22 NOTE — ED Provider Notes (Signed)
[facility] Emergency Department Provider Note  ____________________________________________   First MD Initiated Contact with Patient 07/22/16 1144     (approximate)  I have reviewed the triage vital signs and the nursing notes.   HISTORY  Chief Complaint Fall  Level V exemption history Limited by the patient's dementia  HPI Tamara Simon is a 83 y.o. female who sent to the emergency department via EMS after a fall at home. She says she's not sure why she fell and she didn't want to come to the hospital and that she feels "pretty good. She is in no pain whatsoever. She denies chest pain shortness of breath abdominal pain nausea vomiting palpitations blurred vision double vision.   Past Medical History:  Diagnosis Date  . Arthritis   . Breast cancer of upper-outer quadrant of left female breast [facility]) March 2016   Triple negative  . Chronic kidney disease   . Dementia   . Diabetes mellitus without complication ([place])   . GERD (gastroesophageal reflux disease)   . Hearing loss   . Hyperlipidemia   . Hypertension   . Left ventricular hypertrophy   . Paget's bone disease   . Thyroid disease     Patient Active Problem List   Diagnosis Date Noted  . Breast cancer of upper-outer quadrant of left female breast ([place]) 05/18/2014    Past Surgical History:  Procedure Laterality Date  . HERNIA REPAIR    . partial mastectomy left breast  07/08/2014  . THYROID SURGERY  83 years old    Prior to Admission medications   Medication Sig Start Date End Date Taking? Authorizing Provider  amLODipine (NORVASC) 5 MG tablet Take 5 mg by mouth daily. 04/16/14  Yes [provider]  aspirin 81 MG tablet Take 81 mg by mouth daily.   Yes [provider]  docusate sodium (COLACE) 100 MG capsule Take 100 mg by mouth 2 (two) times daily.  07/01/14  Yes [provider]  glimepiride (AMARYL) 1 MG tablet Take 1 mg by mouth daily with breakfast.    Yes [provider]  iron polysaccharides (NIFEREX) 150 MG capsule Take 150 mg by mouth daily.  11/02/15 11/01/16 Yes [provider]  levothyroxine (SYNTHROID, LEVOTHROID) 125 MCG tablet Take 125 mcg by mouth daily before breakfast.  03/09/14  Yes [provider]  loratadine (CLARITIN) 10 MG tablet Take 10 mg by mouth daily as needed for allergies.   Yes [provider]  losartan-hydrochlorothiazide (HYZAAR) 50-12.5 MG tablet Take 1 tablet by mouth daily. 06/12/16 06/12/17 Yes [provider]  memantine (NAMENDA) 5 MG tablet Take 5 mg by mouth 2 (two) times daily.    Yes [provider]  polyvinyl alcohol (LIQUIFILM TEARS) 1.4 % ophthalmic solution Place 1 drop into the left eye 4 (four) times daily as needed for dry eyes.   Yes [provider]  traMADol (ULTRAM) 50 MG tablet Take 50 mg by mouth daily as needed for moderate pain.  07/01/14  Yes [provider]    Allergies Patient has no known allergies.  Family History  Problem Relation Age of Onset  . Breast cancer Sister     Social History Social History  Substance Use Topics  . Smoking status: Never Smoker  . Smokeless tobacco: Never Used  . Alcohol use 0.0 oz/week    Review of Systems Level V exemption history Limited by the patient's dementia  ____________________________________________   PHYSICAL EXAM:  VITAL SIGNS: ED Triage  Vitals  Enc Vitals Group     BP 07/22/16 1108 (!) 145/58     Pulse Rate 07/22/16 1108 71     Resp 07/22/16 1108 16     Temp 07/22/16 1108 99.5 F (37.5 C)     Temp Source 07/22/16 1108 Oral     SpO2 07/22/16 1108 100 %     Weight 07/22/16 1109 130 lb (59 kg)     Height 07/22/16 1109 5\' 2"  (1.575 m)     Head Circumference --      Peak Flow --      Pain Score --      Pain Loc --      Pain Edu? --      Excl. in [place]? --     Constitutional: Alert and oriented x 4 Moderate dementia but very pleasant and in no acute  distress Eyes: PERRL EOMI. Head: Atraumatic. Nose: No congestion/rhinnorhea. Mouth/Throat: No trismus Neck: No stridor.   Cardiovascular: Normal rate, regular rhythm. Grossly normal heart sounds.  Good peripheral circulation. Respiratory: Normal respiratory effort.  No retractions. Lungs CTAB and moving good air Gastrointestinal: Soft nontender Musculoskeletal: No lower extremity edema   Neurologic:  Normal speech and language. No gross focal neurologic deficits are appreciated. Skin:  Skin is warm, dry and intact. No rash noted. Psychiatric: Clear dementia.     ____________________________________________   LABS (all labs ordered are listed, but only abnormal results are displayed)  Labs Reviewed  BASIC METABOLIC PANEL - Abnormal; Notable for the following:       Result Value   Potassium 3.4 (*)    Glucose, Bld 103 (*)    BUN 29 (*)    Creatinine, Ser 1.03 (*)    GFR calc non Af Amer 46 (*)    GFR calc Af Amer 54 (*)    All other components within normal limits  CBC - Abnormal; Notable for the following:    Hemoglobin 11.5 (*)    HCT 33.8 (*)    RDW 14.7 (*)    All other components within normal limits  TROPONIN I - Abnormal; Notable for the following:    Troponin I 0.04 (*)    All other components within normal limits  URINALYSIS, COMPLETE (UACMP) WITH MICROSCOPIC - Abnormal; Notable for the following:    Color, Urine YELLOW (*)    APPearance CLEAR (*)    Hgb urine dipstick SMALL (*)    Protein, ur 30 (*)    Squamous Epithelial / LPF 0-5 (*)    All other components within normal limits  TROPONIN I - Abnormal; Notable for the following:    Troponin I 0.03 (*)    All other components within normal limits    Slightly elevated troponin although down trending on second __________________________________________  EKG  ED ECG REPORT I, Darel Hong, the attending physician, personally viewed and interpreted this ECG.  Date: 07/22/2016 Rate: 71 Rhythm: normal  sinus rhythm QRS Axis: normal Intervals: normal ST/T Wave abnormalities: T-wave inversion in 1 aVL V4 V5 V6 Narrative Interpretation: unremarkable  ____________________________________________  RADIOLOGY  Head CT with no acute disease chest x-ray with no acute disease   PROCEDURES  Procedure(s) performed: no  Procedures  Critical Care performed: no  Observation: no ____________________________________________   INITIAL IMPRESSION / ASSESSMENT AND PLAN / ED COURSE  Pertinent labs & imaging results that were available during my care of the patient were reviewed by me and considered in my medical decision making (see chart for  details).  The patient arrives with clear dementia although is pleasant cooperative laughing joking and in no acute distress. Unclear etiology of her fall so I obtained a head CT and chest x-ray as well as basic labs which are fortunately all negative. At this point the patient is able to ambulate and she is stable for discharge back to her facility.      ____________________________________________   FINAL CLINICAL IMPRESSION(S) / ED DIAGNOSES  Final diagnoses:  Fall, initial encounter      NEW MEDICATIONS STARTED DURING THIS VISIT:  Discharge Medication List as of 07/22/2016  1:51 PM       Note:  This document was prepared using Dragon voice recognition software and may include unintentional dictation errors.     Darel Hong, MD 07/23/16 1539

## 2016-07-22 NOTE — Discharge Instructions (Signed)
Fortunately today your blood work and CT scan were normal. Please follow-up with your primary care physician as needed and return to the emergency department for any concerns.  It was a pleasure to take care of you today, and thank you for coming to our emergency department.  If you have any questions or concerns before leaving please ask the nurse to grab me and I'm more than happy to go through your aftercare instructions again.  If you were prescribed any opioid pain medication today such as Norco, Vicodin, Percocet, morphine, hydrocodone, or oxycodone please make sure you do not drive when you are taking this medication as it can alter your ability to drive safely.  If you have any concerns once you are home that you are not improving or are in fact getting worse before you can make it to your follow-up appointment, please do not hesitate to call 911 and come back for further evaluation.  Darel Hong MD  Results for orders placed or performed during the hospital encounter of 21/19/41  Basic metabolic panel  Result Value Ref Range   Sodium 137 135 - 145 mmol/L   Potassium 3.4 (L) 3.5 - 5.1 mmol/L   Chloride 103 101 - 111 mmol/L   CO2 27 22 - 32 mmol/L   Glucose, Bld 103 (H) 65 - 99 mg/dL   BUN 29 (H) 6 - 20 mg/dL   Creatinine, Ser 1.03 (H) 0.44 - 1.00 mg/dL   Calcium 9.0 8.9 - 10.3 mg/dL   GFR calc non Af Amer 46 (L) >60 mL/min   GFR calc Af Amer 54 (L) >60 mL/min   Anion gap 7 5 - 15  CBC  Result Value Ref Range   WBC 7.8 3.6 - 11.0 K/uL   RBC 4.16 3.80 - 5.20 MIL/uL   Hemoglobin 11.5 (L) 12.0 - 16.0 g/dL   HCT 33.8 (L) 35.0 - 47.0 %   MCV 81.1 80.0 - 100.0 fL   MCH 27.7 26.0 - 34.0 pg   MCHC 34.2 32.0 - 36.0 g/dL   RDW 14.7 (H) 11.5 - 14.5 %   Platelets 178 150 - 440 K/uL  Troponin I  Result Value Ref Range   Troponin I 0.04 (HH) <0.03 ng/mL  Urinalysis, Complete w Microscopic  Result Value Ref Range   Color, Urine YELLOW (A) YELLOW   APPearance CLEAR (A) CLEAR   Specific Gravity, Urine 1.006 1.005 - 1.030   pH 6.0 5.0 - 8.0   Glucose, UA NEGATIVE NEGATIVE mg/dL   Hgb urine dipstick SMALL (A) NEGATIVE   Bilirubin Urine NEGATIVE NEGATIVE   Ketones, ur NEGATIVE NEGATIVE mg/dL   Protein, ur 30 (A) NEGATIVE mg/dL   Nitrite NEGATIVE NEGATIVE   Leukocytes, UA NEGATIVE NEGATIVE   RBC / HPF 0-5 0 - 5 RBC/hpf   WBC, UA 0-5 0 - 5 WBC/hpf   Bacteria, UA NONE SEEN NONE SEEN   Squamous Epithelial / LPF 0-5 (A) NONE SEEN  Troponin I  Result Value Ref Range   Troponin I 0.03 (HH) <0.03 ng/mL   Dg Chest 2 View  Result Date: 07/22/2016 CLINICAL DATA:  Altered mental status. History of left ventricular hypertrophy, diabetes, left-sided breast malignancy, Paget's disease of bone. EXAM: CHEST  2 VIEW COMPARISON:  None in PACs FINDINGS: The lungs are borderline hypoinflated. There is no focal infiltrate. The heart is mildly enlarged. The pulmonary vascularity is not engorged. There is no significant pleural effusion. The patient has undergone previous left mastectomy and left axillary  lymph node dissection. There is moderate dextrocurvature of the thoracic spine. IMPRESSION: Mild hypoinflation. No acute pneumonia nor pulmonary edema. Mild cardiomegaly. Electronically Signed   By: David  Martinique M.D.   On: 07/22/2016 12:27   Ct Head Wo Contrast  Result Date: 07/22/2016 CLINICAL DATA:  83 year old with witnessed fall today. No loss of consciousness or head injury. EXAM: CT HEAD WITHOUT CONTRAST TECHNIQUE: Contiguous axial images were obtained from the base of the skull through the vertex without intravenous contrast. COMPARISON:  None. FINDINGS: Brain: There is no evidence of acute intracranial hemorrhage, mass lesion, brain edema or extra-axial fluid collection. The ventricles and subarachnoid spaces are appropriately sized for age. There is no CT evidence of acute cortical infarction. There is mild periventricular low-density, most consistent with chronic small vessel  ischemic changes. Vascular: No hyperdense vessel or unexpected calcification. Skull: Negative for fracture or focal lesion. Sinuses/Orbits: Mild ethmoid sinus mucosal thickening. The additional visualized paranasal sinuses, mastoid air cells and middle ears are clear. Other: None. IMPRESSION: 1. No acute intracranial or calvarial findings. 2. Age-appropriate atrophy with mild periventricular white matter disease. Electronically Signed   By: Richardean Sale M.D.   On: 07/22/2016 12:38

## 2016-07-22 NOTE — ED Triage Notes (Signed)
From facility with fall last night.  No loc.  Has no pain now and has been ambulatory.  Patient is hard of hearing.

## 2016-08-13 NOTE — Telephone Encounter (Signed)
done

## 2016-09-20 ENCOUNTER — Inpatient Hospital Stay: Payer: Medicare Other | Attending: Oncology | Admitting: Oncology

## 2016-09-20 ENCOUNTER — Encounter: Payer: Self-pay | Admitting: Oncology

## 2016-09-20 VITALS — BP 190/76 | HR 64 | Temp 97.6°F | Resp 18 | Ht 62.0 in | Wt 121.0 lb

## 2016-09-20 DIAGNOSIS — Z79899 Other long term (current) drug therapy: Secondary | ICD-10-CM | POA: Insufficient documentation

## 2016-09-20 DIAGNOSIS — Z171 Estrogen receptor negative status [ER-]: Secondary | ICD-10-CM | POA: Diagnosis not present

## 2016-09-20 DIAGNOSIS — E1122 Type 2 diabetes mellitus with diabetic chronic kidney disease: Secondary | ICD-10-CM | POA: Diagnosis not present

## 2016-09-20 DIAGNOSIS — N189 Chronic kidney disease, unspecified: Secondary | ICD-10-CM | POA: Diagnosis not present

## 2016-09-20 DIAGNOSIS — K219 Gastro-esophageal reflux disease without esophagitis: Secondary | ICD-10-CM | POA: Diagnosis not present

## 2016-09-20 DIAGNOSIS — Z853 Personal history of malignant neoplasm of breast: Secondary | ICD-10-CM | POA: Insufficient documentation

## 2016-09-20 DIAGNOSIS — C50919 Malignant neoplasm of unspecified site of unspecified female breast: Secondary | ICD-10-CM | POA: Insufficient documentation

## 2016-09-20 DIAGNOSIS — Z923 Personal history of irradiation: Secondary | ICD-10-CM | POA: Diagnosis not present

## 2016-09-20 DIAGNOSIS — I129 Hypertensive chronic kidney disease with stage 1 through stage 4 chronic kidney disease, or unspecified chronic kidney disease: Secondary | ICD-10-CM | POA: Insufficient documentation

## 2016-09-20 DIAGNOSIS — C50412 Malignant neoplasm of upper-outer quadrant of left female breast: Secondary | ICD-10-CM

## 2016-09-20 DIAGNOSIS — E785 Hyperlipidemia, unspecified: Secondary | ICD-10-CM | POA: Insufficient documentation

## 2016-09-20 DIAGNOSIS — Z7982 Long term (current) use of aspirin: Secondary | ICD-10-CM

## 2016-09-20 DIAGNOSIS — R54 Age-related physical debility: Secondary | ICD-10-CM | POA: Insufficient documentation

## 2016-09-20 DIAGNOSIS — Z7984 Long term (current) use of oral hypoglycemic drugs: Secondary | ICD-10-CM | POA: Insufficient documentation

## 2016-09-20 DIAGNOSIS — E079 Disorder of thyroid, unspecified: Secondary | ICD-10-CM | POA: Diagnosis not present

## 2016-09-20 NOTE — Progress Notes (Signed)
Pt's caregiver states that she is in [employer] now.  She did mention to the caregiver that she has some swelling from time to time of her vagina but it never hurts. No breast sx.

## 2016-09-20 NOTE — Progress Notes (Addendum)
Repeat b/p- 221/69 Manual repeat 205/76 Niece called and got an appt with PCP and the PA at [facility] clinic will see pt  This afternoon at 3:30. Niece taking her over now.  She is asymptomatic

## 2016-09-20 NOTE — Progress Notes (Signed)
Hematology/Oncology Consult note [facility]  [phone] [phone]  Patient Care Team: Tracie Harrier, MD as PCP - General (Internal Medicine) Bary Castilla Forest Gleason, MD (General Surgery)   Name of the patient: Tamara Simon  [id number]  1924-09-08   Date of visit: 09/20/16  Diagnosis- triple negative left breast cancer s/p lumpectomy and RT Chief complaint/ Reason for visit- routine f/u  Heme/Onc history: patient is a 83 year old female who was diagnosed with triple negative left breast cancer in March 2016. Her oncology history is as follows:  04/2014: She self-palpated a left breast mass while bathing.  05/09/14: Ultrasound examination of left breast mass showed an irregularly marginated hypoechoic mass with some posterior acoustic enhancement measuring 1.66 x 1.72 x 1.80 cm. This did not extend to the overlying dermis. BI-RADS-4.   Left breast core biopsy of this mass confirmed grade 3 invasive ductal carcinoma, measuring 0.7 cm, with no DCIS. Receptor status ER/PR/HER2 negative.  06/16/14: Additional imaging at [facility] showed no abnormalities in the contralateral right breast, and no left axillary lymphadenopathy. The index lesion measured 2.5 x 3 cm in the left breast upper outer quadrant.  07/08/14: Left partial mastectomy: 2.5 cm, grade 3, LVI positive, lymph node not evaluated, clear margin invasive ductal carcinoma.  09/14/14: Completed adjuvant radiation therapy (Canadian fractionation plus electron boost), 4272 cGy + 1000 cGy  Patient did not get any adjuvant chemotherapy due to her age anf frailty  Interval history- she lives in a NH.she is here with her niece who reports she has been doing well. She is gaining weight. Denies any pain. Patient reports some vulvar swelling since 1 week  ECOG PS- 2 Pain scale- 0   Review of systems- Review of Systems  Constitutional: Negative for chills, fever, malaise/fatigue and weight  loss.  HENT: Negative for congestion, ear discharge and nosebleeds.   Eyes: Negative for blurred vision.  Respiratory: Negative for cough, hemoptysis, sputum production, shortness of breath and wheezing.   Cardiovascular: Negative for chest pain, palpitations, orthopnea and claudication.  Gastrointestinal: Negative for abdominal pain, blood in stool, constipation, diarrhea, heartburn, melena, nausea and vomiting.  Genitourinary: Negative for dysuria, flank pain, frequency, hematuria and urgency.  Musculoskeletal: Negative for back pain, joint pain and myalgias.  Skin: Negative for rash.  Neurological: Negative for dizziness, tingling, focal weakness, seizures, weakness and headaches.  Endo/Heme/Allergies: Does not bruise/bleed easily.  Psychiatric/Behavioral: Negative for depression and suicidal ideas. The patient does not have insomnia.      No Known Allergies   Past Medical History:  Diagnosis Date  . Arthritis   . Breast cancer of upper-outer quadrant of left female breast [facility]) March 2016   Triple negative  . Chronic kidney disease   . Dementia   . Diabetes mellitus without complication ([place])   . GERD (gastroesophageal reflux disease)   . Hearing loss   . Hyperlipidemia   . Hypertension   . Left ventricular hypertrophy   . Paget's bone disease   . Thyroid disease      Past Surgical History:  Procedure Laterality Date  . HERNIA REPAIR    . partial mastectomy left breast  07/08/2014  . THYROID SURGERY  83 years old    Social History   Social History  . Marital status: Widowed    Spouse name: N/A  . Number of children: N/A  . Years of education: N/A   Occupational History  . Not on file.   Social History Main Topics  .  Smoking status: Never Smoker  . Smokeless tobacco: Never Used  . Alcohol use 0.0 oz/week  . Drug use: No  . Sexual activity: Not on file   Other Topics Concern  . Not on file   Social History Narrative  . No narrative on file     Family History  Problem Relation Age of Onset  . Breast cancer Sister      Current Outpatient Prescriptions:  .  amLODipine (NORVASC) 5 MG tablet, Take 5 mg by mouth daily., Disp: , Rfl:  .  aspirin 81 MG tablet, Take 81 mg by mouth daily., Disp: , Rfl:  .  docusate sodium (COLACE) 100 MG capsule, Take 100 mg by mouth 2 (two) times daily. , Disp: , Rfl:  .  glimepiride (AMARYL) 1 MG tablet, Take 1 mg by mouth daily with breakfast., Disp: , Rfl:  .  iron polysaccharides (NIFEREX) 150 MG capsule, Take 150 mg by mouth daily. , Disp: , Rfl:  .  levothyroxine (SYNTHROID, LEVOTHROID) 125 MCG tablet, Take 125 mcg by mouth daily before breakfast. , Disp: , Rfl:  .  loratadine (CLARITIN) 10 MG tablet, Take 10 mg by mouth daily as needed for allergies., Disp: , Rfl:  .  losartan-hydrochlorothiazide (HYZAAR) 50-12.5 MG tablet, Take 1 tablet by mouth daily., Disp: , Rfl:  .  memantine (NAMENDA) 5 MG tablet, Take 5 mg by mouth 2 (two) times daily. , Disp: , Rfl:  .  polyvinyl alcohol (LIQUIFILM TEARS) 1.4 % ophthalmic solution, Place 1 drop into the left eye 4 (four) times daily as needed for dry eyes., Disp: , Rfl:  .  traMADol (ULTRAM) 50 MG tablet, Take 50 mg by mouth daily as needed for moderate pain. , Disp: , Rfl:   Physical exam:  Vitals:   09/20/16 1416  BP: (!) 190/76  Pulse: 64  Resp: 18  Temp: 97.6 F (36.4 C)  TempSrc: Oral  Weight: 121 lb (54.9 kg)  Height: '5\' 2"'$  (1.575 m)   Physical Exam  Constitutional: She is oriented to person, place, and time.  Elderly frail woman in no acute distress  HENT:  Head: Normocephalic and atraumatic.  Eyes: Pupils are equal, round, and reactive to light. EOM are normal.  Neck: Normal range of motion.  Cardiovascular: Normal rate, regular rhythm and normal heart sounds.   Pulmonary/Chest: Effort normal and breath sounds normal.  Abdominal: Soft. Bowel sounds are normal.  Neurological: She is alert and oriented to person, place, and time.   Skin: Skin is warm and dry.   Breast exam was performed in seated and lying down position. Patient is status post left lumpectomy with a well-healed surgical scar. No evidence of any palpable masses. No evidence of axillary adenopathy. No evidence of any palpable masses or lumps in the right breast. No evidence of right axillary adenopathy  External pelvic exam shows no swelling, inflammation or ulceration of vulva   CMP Latest Ref Rng & Units 07/22/2016  Glucose 65 - 99 mg/dL 103(H)  BUN 6 - 20 mg/dL 29(H)  Creatinine 0.44 - 1.00 mg/dL 1.03(H)  Sodium 135 - 145 mmol/L 137  Potassium 3.5 - 5.1 mmol/L 3.4(L)  Chloride 101 - 111 mmol/L 103  CO2 22 - 32 mmol/L 27  Calcium 8.9 - 10.3 mg/dL 9.0   CBC Latest Ref Rng & Units 07/22/2016  WBC 3.6 - 11.0 K/uL 7.8  Hemoglobin 12.0 - 16.0 g/dL 11.5(L)  Hematocrit 35.0 - 47.0 % 33.8(L)  Platelets 150 - 440 K/uL  178      Assessment and plan- Patient is a 83 y.o. female with a history of stage II triple negative left breast cancer diagnosed in March 2016 status post lumpectomy and adjuvant radiation. She currently remains on clinical surveillance  Clinically she is doing well. No signs of recurrence on todays exam. No plans for continuing mammograms for her age. rtc in 6 months  Uncontrolled HTN- patients niece reports BP well controlled at [facility]. She will touch base with Dr. Ginette Pitman  RTC in 6 months for routine physical exam   Visit Diagnosis 1. Malignant neoplasm of upper-outer quadrant of left breast in female, estrogen receptor negative ([place])      Dr. Randa Evens, MD, MPH [facility] Pager- [phone] 09/20/2016 2:13 PM

## 2017-01-27 ENCOUNTER — Encounter: Payer: Self-pay | Admitting: Emergency Medicine

## 2017-01-27 ENCOUNTER — Emergency Department
Admission: EM | Admit: 2017-01-27 | Discharge: 2017-01-27 | Disposition: A | Discharge status: Home / Self Care | Payer: Medicare Other | Attending: Emergency Medicine | Admitting: Emergency Medicine

## 2017-01-27 ENCOUNTER — Other Ambulatory Visit: Payer: Self-pay

## 2017-01-27 DIAGNOSIS — Z7982 Long term (current) use of aspirin: Secondary | ICD-10-CM | POA: Diagnosis not present

## 2017-01-27 DIAGNOSIS — Z139 Encounter for screening, unspecified: Secondary | ICD-10-CM | POA: Diagnosis not present

## 2017-01-27 DIAGNOSIS — Z79899 Other long term (current) drug therapy: Secondary | ICD-10-CM | POA: Diagnosis not present

## 2017-01-27 DIAGNOSIS — Z853 Personal history of malignant neoplasm of breast: Secondary | ICD-10-CM | POA: Insufficient documentation

## 2017-01-27 DIAGNOSIS — F0390 Unspecified dementia without behavioral disturbance: Secondary | ICD-10-CM | POA: Insufficient documentation

## 2017-01-27 DIAGNOSIS — E1122 Type 2 diabetes mellitus with diabetic chronic kidney disease: Secondary | ICD-10-CM | POA: Insufficient documentation

## 2017-01-27 DIAGNOSIS — E039 Hypothyroidism, unspecified: Secondary | ICD-10-CM | POA: Diagnosis not present

## 2017-01-27 DIAGNOSIS — I129 Hypertensive chronic kidney disease with stage 1 through stage 4 chronic kidney disease, or unspecified chronic kidney disease: Secondary | ICD-10-CM | POA: Insufficient documentation

## 2017-01-27 DIAGNOSIS — N189 Chronic kidney disease, unspecified: Secondary | ICD-10-CM | POA: Insufficient documentation

## 2017-01-27 LAB — CBC
HEMATOCRIT: 34.1 % — AB (ref 35.0–47.0)
Hemoglobin: 11.2 g/dL — ABNORMAL LOW (ref 12.0–16.0)
MCH: 27.5 pg (ref 26.0–34.0)
MCHC: 32.9 g/dL (ref 32.0–36.0)
MCV: 83.5 fL (ref 80.0–100.0)
PLATELETS: 244 10*3/uL (ref 150–440)
RBC: 4.09 MIL/uL (ref 3.80–5.20)
RDW: 15 % — ABNORMAL HIGH (ref 11.5–14.5)
WBC: 4.4 10*3/uL (ref 3.6–11.0)

## 2017-01-27 LAB — BASIC METABOLIC PANEL
Anion gap: 8 (ref 5–15)
BUN: 26 mg/dL — AB (ref 6–20)
CHLORIDE: 105 mmol/L (ref 101–111)
CO2: 27 mmol/L (ref 22–32)
CREATININE: 1 mg/dL (ref 0.44–1.00)
Calcium: 9.2 mg/dL (ref 8.9–10.3)
GFR calc non Af Amer: 48 mL/min — ABNORMAL LOW (ref >60)
GFR, EST AFRICAN AMERICAN: 56 mL/min — AB (ref >60)
Glucose, Bld: 153 mg/dL — ABNORMAL HIGH (ref 65–99)
POTASSIUM: 3.8 mmol/L (ref 3.5–5.1)
Sodium: 140 mmol/L (ref 135–145)

## 2017-01-27 NOTE — ED Triage Notes (Signed)
FIRST NURSE NOTE-from  house, fell asleep at table and staff thought she passed out, bg 205 but was eating/drinking. Pt asking if going to [place] [facility]. Pt has no complaints.

## 2017-01-27 NOTE — ED Triage Notes (Signed)
Pt via ems from [facility]; states she fell asleep at breakfast table; staff thought she passed out and called ems. Pt states she did not pass out, but that she fell asleep. She denies that she has slept at breakfast before. Denies pain.  NAD noted.

## 2017-01-27 NOTE — ED Provider Notes (Signed)
[facility] Emergency Department Provider Note    First MD Initiated Contact with Patient 01/27/17 1026     (approximate)  I have reviewed the triage vital signs and the nursing notes.  Level V caveat history Limited secondary to dementia HISTORY  Chief Complaint fell asleep at table   HPI Tamara Simon is a 83 y.o. female with [name] of chronic medical conditions including dementia presents to the emergency department via EMS with history of "falling asleep at the breakfast table today at [place]. Patient states that after eating she fell asleep. Patient states that she did not rest well last night. Patient denies any pain or any complaints at this time. EMS states that the patient wasn't transported to the emergency department secondary to fact that the staff at [facility] was concerned that the patient may have had a syncopal episode.   Past Medical History:  Diagnosis Date  . Arthritis   . Breast cancer of upper-outer quadrant of left female breast [facility]) March 2016   Triple negative  . Chronic kidney disease   . Dementia   . Diabetes mellitus without complication ([place])   . GERD (gastroesophageal reflux disease)   . Hearing loss   . Hyperlipidemia   . Hypertension   . Left ventricular hypertrophy   . Paget's bone disease   . Thyroid disease     Patient Active Problem List   Diagnosis Date Noted  . Breast cancer ([place]) 09/20/2016  . Breast cancer of upper-outer quadrant of left female breast ([place]) 05/18/2014  . Pure hypercholesterolemia 04/29/2014  . DM (diabetes mellitus) type II controlled, neurological manifestation ([place]) 09/19/2013  . Benign essential hypertension 09/19/2013  . GERD (gastroesophageal reflux disease) 09/19/2013  . Hypothyroid 09/19/2013  . LVH (left ventricular hypertrophy) due to hypertensive disease 09/19/2013  . Paget's bone disease 09/19/2013    Past Surgical History:  Procedure Laterality Date  . HERNIA  REPAIR    . partial mastectomy left breast  07/08/2014  . THYROID SURGERY  83 years old    Prior to Admission medications   Medication Sig Start Date End Date Taking? Authorizing Provider  amLODipine (NORVASC) 5 MG tablet Take 5 mg by mouth daily. 04/16/14  Yes [provider]  aspirin 81 MG tablet Take 81 mg by mouth daily.   Yes [provider]  docusate sodium (COLACE) 100 MG capsule Take 100 mg by mouth daily as needed.  07/01/14  Yes [provider]  glimepiride (AMARYL) 1 MG tablet Take 1 mg by mouth daily with breakfast.   Yes [provider]  iron polysaccharides (NIFEREX) 150 MG capsule Take 150 mg by mouth daily.  11/02/15 01/27/17 Yes [provider]  levothyroxine (SYNTHROID, LEVOTHROID) 125 MCG tablet Take 125 mcg by mouth daily before breakfast.  03/09/14  Yes [provider]  loratadine (CLARITIN) 10 MG tablet Take 10 mg by mouth daily as needed for allergies.   Yes [provider]  losartan-hydrochlorothiazide (HYZAAR) 50-12.5 MG tablet Take 1 tablet by mouth daily. 06/12/16 06/12/17 Yes [provider]  memantine (NAMENDA) 5 MG tablet Take 5 mg by mouth 2 (two) times daily.    Yes [provider]  polyvinyl alcohol (LIQUIFILM TEARS) 1.4 % ophthalmic solution Place 1 drop into the left eye 4 (four) times daily as needed for dry eyes.   Yes [provider]  QUEtiapine (SEROQUEL) 25 MG tablet Take 12.5 mg by mouth at bedtime.   Yes [provider]  traMADol (ULTRAM) 50 MG tablet Take 50 mg by mouth daily as needed for moderate pain.  07/01/14  Yes [provider]  calcium-vitamin D (OSCAL WITH D) 500-200 MG-UNIT tablet Take 1 tablet by mouth 2 (two) times daily.    [provider]    Allergies No known drug allergies  Family History  Problem Relation Age of Onset  . Breast cancer Sister     Social History Social History   Tobacco Use  . Smoking status: Never  Smoker  . Smokeless tobacco: Never Used  Substance Use Topics  . Alcohol use: Yes    Alcohol/week: 0.0 oz    Comment: wine at times- none since being in asst living  . Drug use: No    Review of Systems Constitutional: No fever/chills Eyes: No visual changes. ENT: No sore throat. Cardiovascular: Denies chest pain. Respiratory: Denies shortness of breath. Gastrointestinal: No abdominal pain.  No nausea, no vomiting.  No diarrhea.  No constipation. Genitourinary: Negative for dysuria. Musculoskeletal: Negative for neck pain.  Negative for back pain. Integumentary: Negative for rash. Neurological: Negative for headaches, focal weakness or numbness.   ____________________________________________   PHYSICAL EXAM:  VITAL SIGNS: ED Triage Vitals [01/27/17 0941]  Enc Vitals Group     BP (!) 179/58     Pulse Rate 68     Resp 14     Temp 98.3 F (36.8 C)     Temp Source Oral     SpO2 100 %     Weight      Height      Head Circumference      Peak Flow      Pain Score      Pain Loc      Pain Edu?      Excl. in [place]?     Constitutional: Alert and  Well appearing and in no acute distress. Eyes: Conjunctivae are normal. PERRL. EOMI. Head: Atraumatic. Mouth/Throat: Mucous membranes are moist. Oropharynx non-erythematous. Neck: No stridor.   Cardiovascular: Normal rate, regular rhythm. Good peripheral circulation. Grossly normal heart sounds. Respiratory: Normal respiratory effort.  No retractions. Lungs CTAB. Gastrointestinal: Soft and nontender. No distention. :  Musculoskeletal: No lower extremity tenderness nor edema. No gross deformities of extremities. Neurologic:  Normal speech and language. No gross focal neurologic deficits are appreciated.  Skin:  Skin is warm, dry and intact. No rash noted. Psychiatric: Mood and affect are normal. Speech and behavior are normal.  ____________________________________________   LABS (all labs ordered are listed, but only abnormal  results are displayed)  Labs Reviewed  BASIC METABOLIC PANEL - Abnormal; Notable for the following components:      Result Value   Glucose, Bld 153 (*)    BUN 26 (*)    GFR calc non Af Amer 48 (*)    GFR calc Af Amer 56 (*)    All other components within normal limits  CBC - Abnormal; Notable for the following components:   Hemoglobin 11.2 (*)    HCT 34.1 (*)    RDW 15.0 (*)    All other components within normal limits  URINALYSIS, COMPLETE (UACMP) WITH MICROSCOPIC  CBG MONITORING, ED   ____________________________________________  EKG  ED ECG REPORT I, [place] N BROWN, the attending physician, personally viewed and interpreted this ECG.   Date: 01/27/2017  EKG Time: 9:39 AM  Rate: 65  Rhythm: Normal sinus rhythm  Axis: Normal  Intervals: Normal  ST&T Change: None   Procedures  ____________________________________________   INITIAL IMPRESSION / ASSESSMENT AND PLAN / ED COURSE  As part of my medical decision making, I reviewed the following data within the electronic MEDICAL RECORD NUMBER 83 year old female presenting to the emergency department with concern for possible syncopal episode from [place]. Patient states that she did not pass out". Patient states I just went to sleep". Laboratory data unremarkable likewise EKG. ____________________________________________  FINAL CLINICAL IMPRESSION(S) / ED DIAGNOSES  Final diagnoses:  Encounter for medical screening examination     MEDICATIONS GIVEN DURING THIS VISIT:  Medications - No data to display   ED Discharge Orders    None       Note:  This document was prepared using Dragon voice recognition software and may include unintentional dictation errors.    Gregor Hams, MD 01/27/17 [phone]

## 2017-03-27 ENCOUNTER — Encounter: Payer: Self-pay | Admitting: Oncology

## 2017-03-27 ENCOUNTER — Inpatient Hospital Stay: Payer: Medicare Other | Attending: Oncology | Admitting: Oncology

## 2017-03-27 VITALS — BP 210/72 | HR 67 | Temp 97.7°F | Resp 18 | Ht 62.0 in | Wt 125.0 lb

## 2017-03-27 DIAGNOSIS — I10 Essential (primary) hypertension: Secondary | ICD-10-CM

## 2017-03-27 DIAGNOSIS — Z9012 Acquired absence of left breast and nipple: Secondary | ICD-10-CM | POA: Diagnosis not present

## 2017-03-27 DIAGNOSIS — Z853 Personal history of malignant neoplasm of breast: Secondary | ICD-10-CM | POA: Diagnosis not present

## 2017-03-27 DIAGNOSIS — Z923 Personal history of irradiation: Secondary | ICD-10-CM

## 2017-03-27 DIAGNOSIS — Z08 Encounter for follow-up examination after completed treatment for malignant neoplasm: Secondary | ICD-10-CM

## 2017-03-27 NOTE — Progress Notes (Signed)
No new changes noted today. B/p recheck manual ( 160/79)

## 2017-03-28 NOTE — Progress Notes (Signed)
Hematology/Oncology Consult note [facility]  Telephone:([phone] [phone]  Patient Care Team: Tracie Harrier, MD as PCP - General (Internal Medicine) Bary Castilla Forest Gleason, MD (General Surgery)   Name of the patient: Tamara Simon  [id number]  [birth year]   Date of visit: 03/28/17  Diagnosis- triple negative left breast cancer s/p lumpectomy and RT Chief complaint/ Reason for visit- routine f/u  Heme/Onc history: patient is a 84 year old female who was diagnosed with triple negative left breast cancer in March 2016. Her oncology history is as follows:  04/2014: She self-palpated a left breast mass while bathing.  05/09/14: Ultrasound examination of left breast mass showed an irregularly marginated hypoechoic mass with some posterior acoustic enhancement measuring 1.66 x 1.72 x 1.80 cm. This did not extend to the overlying dermis. BI-RADS-4.   Left breast core biopsy of this mass confirmed grade 3 invasive ductal carcinoma, measuring 0.7 cm, with no DCIS. Receptor status ER/PR/HER2 negative.  06/16/14: Additional imaging at [facility] showed no abnormalities in the contralateral right breast, and no left axillary lymphadenopathy. The index lesion measured 2.5 x 3 cm in the left breast upper outer quadrant.  07/08/14: Left partial mastectomy: 2.5 cm, grade 3, LVI positive, lymph node not evaluated, clear margin invasive ductal carcinoma.  09/14/14: Completed adjuvant radiation therapy (Canadian fractionation plus electron boost), 4272 cGy + 1000 cGy  Patient did not get any adjuvant chemotherapy due to her age anf frailty  Interval history-patient is doing well since her last visit.  She continues to live at her nursing home.  She is here with her niece today who always comes with her for her doctor's appointments and she does not report any new concerns at this time.  Her appetite is good and she has not lost any weight unintentionally.  She denies  any aches or pains at this time  ECOG PS- 2 Pain scale- 0   Review of systems- Review of Systems  Constitutional: Negative for chills, fever, malaise/fatigue and weight loss.  HENT: Negative for congestion, ear discharge and nosebleeds.   Eyes: Negative for blurred vision.  Respiratory: Negative for cough, hemoptysis, sputum production, shortness of breath and wheezing.   Cardiovascular: Negative for chest pain, palpitations, orthopnea and claudication.  Gastrointestinal: Negative for abdominal pain, blood in stool, constipation, diarrhea, heartburn, melena, nausea and vomiting.  Genitourinary: Negative for dysuria, flank pain, frequency, hematuria and urgency.  Musculoskeletal: Negative for back pain, joint pain and myalgias.  Skin: Negative for rash.  Neurological: Negative for dizziness, tingling, focal weakness, seizures, weakness and headaches.  Endo/Heme/Allergies: Does not bruise/bleed easily.  Psychiatric/Behavioral: Negative for depression and suicidal ideas. The patient does not have insomnia.        No Known Allergies   Past Medical History:  Diagnosis Date  . Arthritis   . Breast cancer of upper-outer quadrant of left female breast [facility]) March 2016   Triple negative  . Chronic kidney disease   . Dementia   . Diabetes mellitus without complication ([place])   . GERD (gastroesophageal reflux disease)   . Hearing loss   . Hyperlipidemia   . Hypertension   . Left ventricular hypertrophy   . Paget's bone disease   . Thyroid disease      Past Surgical History:  Procedure Laterality Date  . HERNIA REPAIR    . partial mastectomy left breast  07/08/2014  . THYROID SURGERY  84 years old    Social History   Socioeconomic History  . Marital  status: Widowed    Spouse name: Not on file  . Number of children: Not on file  . Years of education: Not on file  . Highest education level: Not on file  Social Needs  . Financial resource strain: Not on file  . Food  insecurity - worry: Not on file  . Food insecurity - inability: Not on file  . Transportation needs - medical: Not on file  . Transportation needs - non-medical: Not on file  Occupational History  . Not on file  Tobacco Use  . Smoking status: Never Smoker  . Smokeless tobacco: Never Used  Substance and Sexual Activity  . Alcohol use: Yes    Alcohol/week: 0.0 oz    Comment: wine at times- none since being in asst living  . Drug use: No  . Sexual activity: Not on file  Other Topics Concern  . Not on file  Social History Narrative  . Not on file    Family History  Problem Relation Age of Onset  . Breast cancer Sister      Current Outpatient Medications:  .  amLODipine (NORVASC) 5 MG tablet, Take 5 mg by mouth daily., Disp: , Rfl:  .  aspirin 81 MG tablet, Take 81 mg by mouth daily., Disp: , Rfl:  .  calcium-vitamin D (OSCAL WITH D) 500-200 MG-UNIT tablet, Take 1 tablet by mouth 2 (two) times daily., Disp: , Rfl:  .  docusate sodium (COLACE) 100 MG capsule, Take 100 mg by mouth daily as needed. , Disp: , Rfl:  .  glimepiride (AMARYL) 1 MG tablet, Take 1 mg by mouth daily with breakfast., Disp: , Rfl:  .  levothyroxine (SYNTHROID, LEVOTHROID) 125 MCG tablet, Take 125 mcg by mouth daily before breakfast. , Disp: , Rfl:  .  losartan-hydrochlorothiazide (HYZAAR) 50-12.5 MG tablet, Take 1 tablet by mouth daily., Disp: , Rfl:  .  traMADol (ULTRAM) 50 MG tablet, Take 50 mg by mouth daily as needed for moderate pain. , Disp: , Rfl:  .  iron polysaccharides (NIFEREX) 150 MG capsule, Take 150 mg by mouth daily. , Disp: , Rfl:  .  loratadine (CLARITIN) 10 MG tablet, Take 10 mg by mouth daily as needed for allergies., Disp: , Rfl:  .  memantine (NAMENDA) 5 MG tablet, Take 5 mg by mouth 2 (two) times daily. , Disp: , Rfl:  .  polyvinyl alcohol (LIQUIFILM TEARS) 1.4 % ophthalmic solution, Place 1 drop into the left eye 4 (four) times daily as needed for dry eyes., Disp: , Rfl:  .  QUEtiapine  (SEROQUEL) 25 MG tablet, Take 12.5 mg by mouth at bedtime., Disp: , Rfl:   Physical exam:  Vitals:   03/27/17 1444  BP: (!) 210/72  Pulse: 67  Resp: 18  Temp: 97.7 F (36.5 C)  TempSrc: Oral  Weight: 125 lb (56.7 kg)  Height: '5\' 2"'$  (1.575 m)   Physical Exam  Constitutional: She is oriented to person, place, and time and well-developed, well-nourished, and in no distress.  HENT:  Head: Normocephalic and atraumatic.  Eyes: EOM are normal. Pupils are equal, round, and reactive to light.  Neck: Normal range of motion.  Cardiovascular: Normal rate, regular rhythm and normal heart sounds.  Pulmonary/Chest: Effort normal and breath sounds normal.  Abdominal: Soft. Bowel sounds are normal.  Neurological: She is alert and oriented to person, place, and time.  Skin: Skin is warm and dry.   Breast exam was performed in seated and lying down position. Patient  is status post left lumpectomy with a well-healed surgical scar. No evidence of any palpable masses. No evidence of axillary adenopathy. No evidence of any palpable masses or lumps in the right breast. No evidence of right axillary adenopathy   CMP Latest Ref Rng & Units 01/27/2017  Glucose 65 - 99 mg/dL 153(H)  BUN 6 - 20 mg/dL 26(H)  Creatinine 0.44 - 1.00 mg/dL 1.00  Sodium 135 - 145 mmol/L 140  Potassium 3.5 - 5.1 mmol/L 3.8  Chloride 101 - 111 mmol/L 105  CO2 22 - 32 mmol/L 27  Calcium 8.9 - 10.3 mg/dL 9.2   CBC Latest Ref Rng & Units 01/27/2017  WBC 3.6 - 11.0 K/uL 4.4  Hemoglobin 12.0 - 16.0 g/dL 11.2(L)  Hematocrit 35.0 - 47.0 % 34.1(L)  Platelets 150 - 440 K/uL 244     Assessment and plan- Patient is a 84 y.o. female with a history of stage II triple negative left breast cancer diagnosed in March 2016 status post lumpectomy and adjuvant radiation. She currently remains on clinical surveillance  Clinically she is doing well and there is no evidence of recurrence on today's exam.  Given her age and frailty we are  holding off on doing any screening mammograms at this time.  She will continue to get clinical breast exams once a year.  I will see her back in 1 year.   Uncontrolled hypertension: On initial check her systolic blood pressure was 210 but on repeat check it was 160.  Patient was supposed to take 7.5 mg of amlodipine and her dose was recently increased to 7.5 from 5 mg by her PCP.  However her nursing home has been erroneously giving her only 2.5 mg instead of 7.5.  This has been clarified with the nursing home and they will start giving her 7.5 mg of Norvasc.  She will continue to follow-up with Dr. Ginette Pitman for her hypertension   Visit Diagnosis 1. Encounter for follow-up surveillance of breast cancer   2. Uncontrolled hypertension      Dr. Randa Evens, MD, MPH [facility] Pager- [phone] 03/28/2017 10:45 AM

## 2017-04-24 ENCOUNTER — Other Ambulatory Visit: Payer: Self-pay

## 2017-04-24 ENCOUNTER — Emergency Department
Admission: EM | Admit: 2017-04-24 | Discharge: 2017-04-24 | Disposition: A | Discharge status: Home / Self Care | Payer: Medicare Other | Attending: Emergency Medicine | Admitting: Emergency Medicine

## 2017-04-24 DIAGNOSIS — N189 Chronic kidney disease, unspecified: Secondary | ICD-10-CM | POA: Insufficient documentation

## 2017-04-24 DIAGNOSIS — E1122 Type 2 diabetes mellitus with diabetic chronic kidney disease: Secondary | ICD-10-CM | POA: Insufficient documentation

## 2017-04-24 DIAGNOSIS — R4182 Altered mental status, unspecified: Secondary | ICD-10-CM | POA: Insufficient documentation

## 2017-04-24 DIAGNOSIS — Z853 Personal history of malignant neoplasm of breast: Secondary | ICD-10-CM | POA: Diagnosis not present

## 2017-04-24 DIAGNOSIS — Z7982 Long term (current) use of aspirin: Secondary | ICD-10-CM | POA: Insufficient documentation

## 2017-04-24 DIAGNOSIS — E039 Hypothyroidism, unspecified: Secondary | ICD-10-CM | POA: Insufficient documentation

## 2017-04-24 DIAGNOSIS — I129 Hypertensive chronic kidney disease with stage 1 through stage 4 chronic kidney disease, or unspecified chronic kidney disease: Secondary | ICD-10-CM | POA: Diagnosis not present

## 2017-04-24 DIAGNOSIS — Z79899 Other long term (current) drug therapy: Secondary | ICD-10-CM | POA: Insufficient documentation

## 2017-04-24 DIAGNOSIS — F0390 Unspecified dementia without behavioral disturbance: Secondary | ICD-10-CM | POA: Insufficient documentation

## 2017-04-24 LAB — CBC WITH DIFFERENTIAL/PLATELET
BASOS PCT: 1 %
Basophils Absolute: 0 10*3/uL (ref 0–0.1)
EOS ABS: 0 10*3/uL (ref 0–0.7)
EOS PCT: 0 %
HCT: 34.4 % — ABNORMAL LOW (ref 35.0–47.0)
Hemoglobin: 11.4 g/dL — ABNORMAL LOW (ref 12.0–16.0)
Lymphocytes Relative: 23 %
Lymphs Abs: 1.2 10*3/uL (ref 1.0–3.6)
MCH: 27.2 pg (ref 26.0–34.0)
MCHC: 33.2 g/dL (ref 32.0–36.0)
MCV: 82 fL (ref 80.0–100.0)
MONO ABS: 0.5 10*3/uL (ref 0.2–0.9)
Monocytes Relative: 10 %
Neutro Abs: 3.6 10*3/uL (ref 1.4–6.5)
Neutrophils Relative %: 66 %
Platelets: 158 10*3/uL (ref 150–440)
RBC: 4.2 MIL/uL (ref 3.80–5.20)
RDW: 14.3 % (ref 11.5–14.5)
WBC: 5.3 10*3/uL (ref 3.6–11.0)

## 2017-04-24 LAB — COMPREHENSIVE METABOLIC PANEL
ALBUMIN: 3.8 g/dL (ref 3.5–5.0)
ALT: 16 U/L (ref 14–54)
ANION GAP: 12 (ref 5–15)
AST: 40 U/L (ref 15–41)
Alkaline Phosphatase: 123 U/L (ref 38–126)
BUN: 31 mg/dL — ABNORMAL HIGH (ref 6–20)
CO2: 23 mmol/L (ref 22–32)
Calcium: 8.4 mg/dL — ABNORMAL LOW (ref 8.9–10.3)
Chloride: 103 mmol/L (ref 101–111)
Creatinine, Ser: 1.15 mg/dL — ABNORMAL HIGH (ref 0.44–1.00)
GFR calc Af Amer: 47 mL/min — ABNORMAL LOW (ref >60)
GFR calc non Af Amer: 41 mL/min — ABNORMAL LOW (ref >60)
GLUCOSE: 137 mg/dL — AB (ref 65–99)
POTASSIUM: 3.4 mmol/L — AB (ref 3.5–5.1)
SODIUM: 138 mmol/L (ref 135–145)
TOTAL PROTEIN: 6.8 g/dL (ref 6.5–8.1)
Total Bilirubin: 0.8 mg/dL (ref 0.3–1.2)

## 2017-04-24 LAB — URINALYSIS, COMPLETE (UACMP) WITH MICROSCOPIC
Bilirubin Urine: NEGATIVE
GLUCOSE, UA: NEGATIVE mg/dL
HGB URINE DIPSTICK: NEGATIVE
Ketones, ur: 5 mg/dL — AB
LEUKOCYTES UA: NEGATIVE
NITRITE: NEGATIVE
PH: 5 (ref 5.0–8.0)
PROTEIN: 30 mg/dL — AB
RBC / HPF: NONE SEEN RBC/hpf (ref 0–5)
Specific Gravity, Urine: 1.017 (ref 1.005–1.030)
Squamous Epithelial / LPF: NONE SEEN

## 2017-04-24 LAB — TROPONIN I: Troponin I: 0.03 ng/mL (ref <0.03)

## 2017-04-24 NOTE — ED Provider Notes (Signed)
[facility] Emergency Department Provider Note  ____________________________________________  Time seen: Approximately 7:29 AM  I have reviewed the triage vital signs and the nursing notes.   HISTORY  Chief Complaint Altered Mental Status  Level 5 caveat:  Portions of the history and physical were unable to be obtained due to dementia   HPI Tamara Simon is a 84 y.o. female the history of dementia, diabetes, chronic kidney disease, hypertension, and hyperlipidemia who presents for evaluation of altered mental status. According to EMS they were called to the nursing home for cardiac arrest however staff did that without checking a pulse. When they arrived patient was at her baseline and had no complaints. Patient has no idea why she is in the emergency room. She is currently at baseline. She denies headache, chest pain, abdominal pain, shortness of breath, nausea, vomiting or diarrhea. She has not been sick at the nursing home. No falls.  Past Medical History:  Diagnosis Date  . Arthritis   . Breast cancer of upper-outer quadrant of left female breast [facility]) March 2016   Triple negative  . Chronic kidney disease   . Dementia   . Diabetes mellitus without complication ([place])   . GERD (gastroesophageal reflux disease)   . Hearing loss   . Hyperlipidemia   . Hypertension   . Left ventricular hypertrophy   . Paget's bone disease   . Thyroid disease     Patient Active Problem List   Diagnosis Date Noted  . Breast cancer ([place]) 09/20/2016  . Breast cancer of upper-outer quadrant of left female breast ([place]) 05/18/2014  . Pure hypercholesterolemia 04/29/2014  . DM (diabetes mellitus) type II controlled, neurological manifestation ([place]) 09/19/2013  . Benign essential hypertension 09/19/2013  . GERD (gastroesophageal reflux disease) 09/19/2013  . Hypothyroid 09/19/2013  . LVH (left ventricular hypertrophy) due to hypertensive disease 09/19/2013  . Paget's  bone disease 09/19/2013    Past Surgical History:  Procedure Laterality Date  . HERNIA REPAIR    . partial mastectomy left breast  07/08/2014  . THYROID SURGERY  84 years old    Prior to Admission medications   Medication Sig Start Date End Date Taking? Authorizing Provider  amLODipine (NORVASC) 5 MG tablet Take 5 mg by mouth daily. 04/16/14   [provider]  aspirin 81 MG tablet Take 81 mg by mouth daily.    [provider]  calcium-vitamin D (OSCAL WITH D) 500-200 MG-UNIT tablet Take 1 tablet by mouth 2 (two) times daily.    [provider]  docusate sodium (COLACE) 100 MG capsule Take 100 mg by mouth daily as needed.  07/01/14   [provider]  glimepiride (AMARYL) 1 MG tablet Take 1 mg by mouth daily with breakfast.    [provider]  iron polysaccharides (NIFEREX) 150 MG capsule Take 150 mg by mouth daily.  11/02/15 01/27/17  [provider]  levothyroxine (SYNTHROID, LEVOTHROID) 125 MCG tablet Take 125 mcg by mouth daily before breakfast.  03/09/14   [provider]  loratadine (CLARITIN) 10 MG tablet Take 10 mg by mouth daily as needed for allergies.    [provider]  losartan-hydrochlorothiazide (HYZAAR) 50-12.5 MG tablet Take 1 tablet by mouth daily. 06/12/16 06/12/17  [provider]  memantine (NAMENDA) 5 MG tablet Take 5 mg by mouth 2 (two) times daily.     [provider]  polyvinyl alcohol (LIQUIFILM TEARS) 1.4 % ophthalmic solution Place 1 drop into the left eye 4 (  four) times daily as needed for dry eyes.    [provider]  QUEtiapine (SEROQUEL) 25 MG tablet Take 12.5 mg by mouth at bedtime.    [provider]  traMADol (ULTRAM) 50 MG tablet Take 50 mg by mouth daily as needed for moderate pain.  07/01/14   [provider]    Allergies Patient has no known allergies.  Family History  Problem Relation Age of Onset  . Breast cancer Sister     Social  History Social History   Tobacco Use  . Smoking status: Never Smoker  . Smokeless tobacco: Never Used  Substance Use Topics  . Alcohol use: Yes    Alcohol/week: 0.0 oz    Comment: wine at times- none since being in asst living  . Drug use: No    Review of Systems  Constitutional: Negative for fever. Eyes: Negative for visual changes. ENT: Negative for sore throat. Neck: No neck pain  Cardiovascular: Negative for chest pain. Respiratory: Negative for shortness of breath. Gastrointestinal: Negative for abdominal pain, vomiting or diarrhea. Genitourinary: Negative for dysuria. Musculoskeletal: Negative for back pain. Skin: Negative for rash. Neurological: Negative for headaches, weakness or numbness. Psych: No SI or HI  ____________________________________________   PHYSICAL EXAM:  VITAL SIGNS: ED Triage Vitals  Enc Vitals Group     BP 04/24/17 0703 (!) 178/57     Pulse Rate 04/24/17 0703 74     Resp 04/24/17 0703 13     Temp 04/24/17 0703 99.7 F (37.6 C)     Temp Source 04/24/17 0703 Oral     SpO2 04/24/17 0703 98 %     Weight 04/24/17 0705 120 lb (54.4 kg)     Height 04/24/17 0705 5\' 7"  (1.702 m)     Head Circumference --      Peak Flow --      Pain Score --      Pain Loc --      Pain Edu? --      Excl. in [place]? --     Constitutional: Alert and oriented x 2. Well appearing and in no apparent distress. HEENT:      Head: Normocephalic and atraumatic.         Eyes: Conjunctivae are normal. Sclera is non-icteric.       Mouth/Throat: Mucous membranes are moist.       Neck: Supple with no signs of meningismus. Cardiovascular: Regular rate and rhythm. No murmurs, gallops, or rubs. 2+ symmetrical distal pulses are present in all extremities. No JVD. Respiratory: Normal respiratory effort. Lungs are clear to auscultation bilaterally. No wheezes, crackles, or rhonchi.  Gastrointestinal: Soft, non tender, and non distended with positive bowel sounds. No rebound or  guarding. Musculoskeletal: Nontender with normal range of motion in all extremities. No edema, cyanosis, or erythema of extremities. Neurologic: Normal speech and language. Face is symmetric. Moving all extremities, normal strength and sensation. No gross focal neurologic deficits are appreciated. Skin: Skin is warm, dry and intact. No rash noted. Psychiatric: Mood and affect are normal. Speech and behavior are normal.  ____________________________________________   LABS (all labs ordered are listed, but only abnormal results are displayed)  Labs Reviewed  CBC WITH DIFFERENTIAL/PLATELET - Abnormal; Notable for the following components:      Result Value   Hemoglobin 11.4 (*)    HCT 34.4 (*)    All other components within normal limits  COMPREHENSIVE METABOLIC PANEL - Abnormal; Notable for the following components:   Potassium 3.4 (*)  Glucose, Bld 137 (*)    BUN 31 (*)    Creatinine, Ser 1.15 (*)    Calcium 8.4 (*)    GFR calc non Af Amer 41 (*)    GFR calc Af Amer 47 (*)    All other components within normal limits  TROPONIN I - Abnormal; Notable for the following components:   Troponin I 0.03 (*)    All other components within normal limits  URINALYSIS, COMPLETE (UACMP) WITH MICROSCOPIC - Abnormal; Notable for the following components:   Color, Urine YELLOW (*)    APPearance HAZY (*)    Ketones, ur 5 (*)    Protein, ur 30 (*)    Bacteria, UA RARE (*)    All other components within normal limits  URINE CULTURE   ____________________________________________  EKG  ED ECG REPORT I, Rudene Re, the attending physician, personally viewed and interpreted this ECG.  Normal sinus rhythm, rate of 72, normal intervals, normal axis, no ST elevations or depressions, T-wave flattening in lateral leads.unchanged from prior from 12/18 ____________________________________________  RADIOLOGY  none   ____________________________________________   PROCEDURES  Procedure(s) performed: None Procedures Critical Care performed:  None ____________________________________________   INITIAL IMPRESSION / ASSESSMENT AND PLAN / ED COURSE  84 y.o. female the history of dementia, diabetes, chronic kidney disease, hypertension, and hyperlipidemia who presents for evaluation of altered mental status. patient is back to her baseline and has no complaints at this time per family who is at bedside, her vitals are within normal limits. Her EKG is unchanged from her baseline. We'll check basic labs to rule out electrolyte abnormalities, infection, dehydration. Patient is neurologically intact, alert and oriented 2, not on blood thinners, does not have a headache. No indication for head CT at this time.    _________________________ 8:46 AM on 04/24/2017 -----------------------------------------  patient remains at baseline.monitor in the emergency department for 2 hours. Labs are unchanged from baseline with no signs of acute dehydration, electrolyte abnormalities, or infection. At this time patient is safe to discharge back home to skilled nursing facility.  As part of my medical decision making, I reviewed the following data within the [place] notes reviewed and incorporated, Labs reviewed , EKG interpreted , Old EKG reviewed, Notes from prior ED visits and [place] Controlled Substance Database    Pertinent labs & imaging results that were available during my care of the patient were reviewed by me and considered in my medical decision making (see chart for details).    ____________________________________________   FINAL CLINICAL IMPRESSION(S) / ED DIAGNOSES  Final diagnoses:  Altered mental status, unspecified altered mental status type      NEW MEDICATIONS STARTED DURING THIS VISIT:  ED Discharge Orders    None       Note:  This document was prepared using  Dragon voice recognition software and may include unintentional dictation errors.    Rudene Re, MD 04/24/17 [phone]

## 2017-04-24 NOTE — ED Triage Notes (Signed)
Pt arrived via EMS from [facility] d/t unresponsive behavior. EMS reports they were called out for cardiac arrest but pt was alert at baseline upon arrival and throughout transport. EMS reports staff at [facility] did not check pulse or have defibrillator on scene. Pt has dementia and is alert at baseline at this time.

## 2017-04-25 LAB — URINE CULTURE: Culture: NO GROWTH

## 2017-08-29 ENCOUNTER — Emergency Department
Admission: EM | Admit: 2017-08-29 | Discharge: 2017-08-29 | Disposition: A | Discharge status: Home / Self Care | Payer: Medicare Other | Attending: Emergency Medicine | Admitting: Emergency Medicine

## 2017-08-29 ENCOUNTER — Other Ambulatory Visit: Payer: Self-pay

## 2017-08-29 ENCOUNTER — Emergency Department: Payer: Medicare Other

## 2017-08-29 ENCOUNTER — Encounter: Payer: Self-pay | Admitting: Emergency Medicine

## 2017-08-29 DIAGNOSIS — Z7984 Long term (current) use of oral hypoglycemic drugs: Secondary | ICD-10-CM | POA: Diagnosis not present

## 2017-08-29 DIAGNOSIS — Z853 Personal history of malignant neoplasm of breast: Secondary | ICD-10-CM | POA: Diagnosis not present

## 2017-08-29 DIAGNOSIS — Z79899 Other long term (current) drug therapy: Secondary | ICD-10-CM | POA: Insufficient documentation

## 2017-08-29 DIAGNOSIS — Z043 Encounter for examination and observation following other accident: Secondary | ICD-10-CM | POA: Diagnosis not present

## 2017-08-29 DIAGNOSIS — F0390 Unspecified dementia without behavioral disturbance: Secondary | ICD-10-CM | POA: Insufficient documentation

## 2017-08-29 DIAGNOSIS — Z7982 Long term (current) use of aspirin: Secondary | ICD-10-CM | POA: Diagnosis not present

## 2017-08-29 DIAGNOSIS — I129 Hypertensive chronic kidney disease with stage 1 through stage 4 chronic kidney disease, or unspecified chronic kidney disease: Secondary | ICD-10-CM | POA: Insufficient documentation

## 2017-08-29 DIAGNOSIS — W19XXXA Unspecified fall, initial encounter: Secondary | ICD-10-CM

## 2017-08-29 DIAGNOSIS — N189 Chronic kidney disease, unspecified: Secondary | ICD-10-CM | POA: Insufficient documentation

## 2017-08-29 DIAGNOSIS — E1122 Type 2 diabetes mellitus with diabetic chronic kidney disease: Secondary | ICD-10-CM | POA: Insufficient documentation

## 2017-08-29 DIAGNOSIS — E039 Hypothyroidism, unspecified: Secondary | ICD-10-CM | POA: Insufficient documentation

## 2017-08-29 LAB — URINALYSIS, COMPLETE (UACMP) WITH MICROSCOPIC
BACTERIA UA: NONE SEEN
BILIRUBIN URINE: NEGATIVE
Glucose, UA: NEGATIVE mg/dL
Ketones, ur: 5 mg/dL — AB
LEUKOCYTES UA: NEGATIVE
Nitrite: NEGATIVE
PROTEIN: 100 mg/dL — AB
SPECIFIC GRAVITY, URINE: 1.011 (ref 1.005–1.030)
SQUAMOUS EPITHELIAL / LPF: NONE SEEN (ref 0–5)
pH: 6 (ref 5.0–8.0)

## 2017-08-29 NOTE — ED Notes (Signed)
Report called to [facility] at [facility], they will provide transportation back to facility

## 2017-08-29 NOTE — ED Notes (Signed)
Assisted patient to the bathroom, pt able to urinate x1

## 2017-08-29 NOTE — ED Triage Notes (Addendum)
Pt arrived via EMS from [facility] with reports of unwitnessed fall.  Staff found patient lying on floor, walker was not near by, pt reported she was going to the bathroom.  Pt has hx of UTI and dementia.  Per EMS strong urine odor in pts room. Pt not c/o any pain and no shortening or rotation noted.

## 2017-08-29 NOTE — ED Provider Notes (Addendum)
[facility] Emergency Department Provider Note  ____________________________________________   I have reviewed the triage vital signs and the nursing notes. Where available I have reviewed prior notes and, if possible and indicated, outside hospital notes.    HISTORY  Chief Complaint Fall    HPI Tamara Simon is a 84 y.o. female who suffers from hardness of hearing and dementia, was found on the floor this morning at the facility where she stays.  She is supposed to ambulate with a walker and her walker was not found near her.  According to EMS she appears to be at her baseline, there was some evidence she was going to the bathroom when she fell.  Apparently she smelled of urine.  She has no complaints and no recollection of falling level 5 chart caveat; no further history available due to patient status.    Past Medical History:  Diagnosis Date  . Arthritis   . Breast cancer of upper-outer quadrant of left female breast [facility]) March 2016   Triple negative  . Chronic kidney disease   . Dementia   . Diabetes mellitus without complication ([place])   . GERD (gastroesophageal reflux disease)   . Hearing loss   . Hyperlipidemia   . Hypertension   . Left ventricular hypertrophy   . Paget's bone disease   . Thyroid disease     Patient Active Problem List   Diagnosis Date Noted  . Breast cancer ([place]) 09/20/2016  . Breast cancer of upper-outer quadrant of left female breast ([place]) 05/18/2014  . Pure hypercholesterolemia 04/29/2014  . DM (diabetes mellitus) type II controlled, neurological manifestation ([place]) 09/19/2013  . Benign essential hypertension 09/19/2013  . GERD (gastroesophageal reflux disease) 09/19/2013  . Hypothyroid 09/19/2013  . LVH (left ventricular hypertrophy) due to hypertensive disease 09/19/2013  . Paget's bone disease 09/19/2013    Past Surgical History:  Procedure Laterality Date  . HERNIA REPAIR    . partial mastectomy left breast   07/08/2014  . THYROID SURGERY  84 years old    Prior to Admission medications   Medication Sig Start Date End Date Taking? Authorizing Provider  amLODipine (NORVASC) 5 MG tablet Take 5 mg by mouth daily. 04/16/14   [provider]  aspirin 81 MG tablet Take 81 mg by mouth daily.    [provider]  calcium-vitamin D (OSCAL WITH D) 500-200 MG-UNIT tablet Take 1 tablet by mouth 2 (two) times daily.    [provider]  docusate sodium (COLACE) 100 MG capsule Take 100 mg by mouth daily as needed.  07/01/14   [provider]  glimepiride (AMARYL) 1 MG tablet Take 1 mg by mouth daily with breakfast.    [provider]  iron polysaccharides (NIFEREX) 150 MG capsule Take 150 mg by mouth daily.  11/02/15 01/27/17  [provider]  levothyroxine (SYNTHROID, LEVOTHROID) 125 MCG tablet Take 125 mcg by mouth daily before breakfast.  03/09/14   [provider]  loratadine (CLARITIN) 10 MG tablet Take 10 mg by mouth daily as needed for allergies.    [provider]  losartan-hydrochlorothiazide (HYZAAR) 50-12.5 MG tablet Take 1 tablet by mouth daily. 06/12/16 06/12/17  [provider]  memantine (NAMENDA) 5 MG tablet Take 5 mg by mouth 2 (two) times daily.     [provider]  polyvinyl alcohol (LIQUIFILM TEARS) 1.4 % ophthalmic solution Place 1 drop into the left eye 4 (four) times daily as needed for dry eyes.  [provider]  QUEtiapine (SEROQUEL) 25 MG tablet Take 12.5 mg by mouth at bedtime.    [provider]  traMADol (ULTRAM) 50 MG tablet Take 50 mg by mouth daily as needed for moderate pain.  07/01/14   [provider]    Allergies Carvedilol; Clonidine; Spironolactone; and Terazosin  Family History  Problem Relation Age of Onset  . Breast cancer Sister     Social History Social History   Tobacco Use  . Smoking status: Never Smoker  . Smokeless tobacco: Never Used  Substance Use  Topics  . Alcohol use: Yes    Alcohol/week: 0.0 oz    Comment: wine at times- none since being in asst living  . Drug use: No    Review of Systems Level 5 chart caveat; no further history available due to patient status.   ____________________________________________   PHYSICAL EXAM:  VITAL SIGNS: ED Triage Vitals  Enc Vitals Group     BP --      Pulse Rate 08/29/17 0733 77     Resp 08/29/17 0733 18     Temp 08/29/17 0733 98.7 F (37.1 C)     Temp Source 08/29/17 0733 Oral     SpO2 --      Weight 08/29/17 0734 127 lb 1.6 oz (57.7 kg)     Height 08/29/17 0734 5\' 3"  (1.6 m)     Head Circumference --      Peak Flow --      Pain Score --      Pain Loc --      Pain Edu? --      Excl. in [place]? --     Constitutional: Alert and oriented to name pleasantly demented. Well appearing and in no acute distress. Eyes: Conjunctivae are normal Head: Atraumatic HEENT: No congestion/rhinnorhea. Mucous membranes are moist.  Oropharynx non-erythematous Neck:   Nontender with no meningismus, no masses, no stridor Cardiovascular: Normal rate, regular rhythm. Grossly normal heart sounds.  Good peripheral circulation. Respiratory: Normal respiratory effort.  No retractions. Lungs CTAB. Abdominal: Soft and nontender. No distention. No guarding no rebound Back:  There is no focal tenderness or step off.  there is no midline tenderness there are no lesions noted. there is no CVA tenderness Musculoskeletal: No lower extremity tenderness, no upper extremity tenderness. No joint effusions, no DVT signs strong distal pulses no edema Neurologic:  Normal speech and language. No gross focal neurologic deficits are appreciated.  Skin:  Skin is warm, dry and intact. No rash noted. Psychiatric: Mood and affect are normal. Speech and behavior are normal.  ____________________________________________   LABS (all labs ordered are listed, but only abnormal results are displayed)  Labs Reviewed   URINALYSIS, COMPLETE (UACMP) WITH MICROSCOPIC    Pertinent labs  results that were available during my care of the patient were reviewed by me and considered in my medical decision making (see chart for details). ____________________________________________  EKG  I personally interpreted any EKGs ordered by me or triage EKG shows sinus rhythm, rate 78 bpm, no acute ST elevation or depression, PVCs noted, no acute ischemia ____________________________________________  RADIOLOGY  Pertinent labs & imaging results that were available during my care of the patient were reviewed by me and considered in my medical decision making (see chart for details). If possible, patient and/or family made aware of any abnormal findings.  No results found. ____________________________________________    PROCEDURES  Procedure(s) performed: None  Procedures  Critical Care performed: None  ____________________________________________  INITIAL IMPRESSION / ASSESSMENT AND PLAN / ED COURSE  Pertinent labs & imaging results that were available during my care of the patient were reviewed by me and considered in my medical decision making (see chart for details).  Patient brought in by EMS, per EMS had reassuring vitals on the way in which she was found down in the nursing home with no complaints.  Because we do not know the exact mechanism of her fall and she is demented we will obtain imaging of her head, she does not appear otherwise toxic I see no evidence of focal injury such as hip fracture etc.  Full painless range of the hips is noted.  Given the history of possible urinary incontinence we will check a UA and we will continue to evaluate the patient closely  ----------------------------------------- 8:35 AM on 08/29/2017 -----------------------------------------  No acute distress, able to ambulate at baseline to the extent that we can determine, imaging and urinalysis reassuring bowel sounds  reassuring, blood pressure slightly high but again first thing in the morning has not had her meds, we will have her closely follow-up as an outpatient no other acute pathology noted.   ____________________________________________   FINAL CLINICAL IMPRESSION(S) / ED DIAGNOSES  Final diagnoses:  None      This chart was dictated using voice recognition software.  Despite best efforts to proofread,  errors can occur which can change meaning.      Schuyler Amor, MD 08/29/17 [id number]    Schuyler Amor, MD 08/29/17 9935    Schuyler Amor, MD 08/29/17 [phone]

## 2017-08-29 NOTE — ED Notes (Signed)
Patient transported to CT 

## 2017-08-29 NOTE — ED Notes (Signed)
Assisted patient into [place], belongings and paperwork given to driver.

## 2017-10-23 ENCOUNTER — Telehealth: Payer: Self-pay | Admitting: *Deleted

## 2017-10-23 NOTE — Telephone Encounter (Signed)
Completed script has been faxed to [facility]

## 2017-10-23 NOTE — Telephone Encounter (Signed)
Patient's sister called requesting bra prescription for patient. The are using [facility] in [place] fax# [phone].

## 2017-12-04 ENCOUNTER — Emergency Department
Admission: EM | Admit: 2017-12-04 | Discharge: 2017-12-04 | Disposition: A | Discharge status: Home / Self Care | Payer: Medicare Other | Attending: Emergency Medicine | Admitting: Emergency Medicine

## 2017-12-04 ENCOUNTER — Other Ambulatory Visit: Payer: Self-pay

## 2017-12-04 DIAGNOSIS — R55 Syncope and collapse: Secondary | ICD-10-CM | POA: Diagnosis not present

## 2017-12-04 DIAGNOSIS — Z79899 Other long term (current) drug therapy: Secondary | ICD-10-CM | POA: Diagnosis not present

## 2017-12-04 DIAGNOSIS — Z7984 Long term (current) use of oral hypoglycemic drugs: Secondary | ICD-10-CM | POA: Insufficient documentation

## 2017-12-04 DIAGNOSIS — I129 Hypertensive chronic kidney disease with stage 1 through stage 4 chronic kidney disease, or unspecified chronic kidney disease: Secondary | ICD-10-CM | POA: Insufficient documentation

## 2017-12-04 DIAGNOSIS — Z853 Personal history of malignant neoplasm of breast: Secondary | ICD-10-CM | POA: Diagnosis not present

## 2017-12-04 DIAGNOSIS — E039 Hypothyroidism, unspecified: Secondary | ICD-10-CM | POA: Diagnosis not present

## 2017-12-04 DIAGNOSIS — F0390 Unspecified dementia without behavioral disturbance: Secondary | ICD-10-CM | POA: Diagnosis not present

## 2017-12-04 DIAGNOSIS — N189 Chronic kidney disease, unspecified: Secondary | ICD-10-CM | POA: Insufficient documentation

## 2017-12-04 DIAGNOSIS — Z7982 Long term (current) use of aspirin: Secondary | ICD-10-CM | POA: Insufficient documentation

## 2017-12-04 DIAGNOSIS — E1122 Type 2 diabetes mellitus with diabetic chronic kidney disease: Secondary | ICD-10-CM | POA: Insufficient documentation

## 2017-12-04 LAB — BASIC METABOLIC PANEL
ANION GAP: 6 (ref 5–15)
BUN: 25 mg/dL — ABNORMAL HIGH (ref 8–23)
CALCIUM: 9.2 mg/dL (ref 8.9–10.3)
CHLORIDE: 107 mmol/L (ref 98–111)
CO2: 28 mmol/L (ref 22–32)
Creatinine, Ser: 1.04 mg/dL — ABNORMAL HIGH (ref 0.44–1.00)
GFR calc non Af Amer: 46 mL/min — ABNORMAL LOW (ref >60)
GFR, EST AFRICAN AMERICAN: 53 mL/min — AB (ref >60)
Glucose, Bld: 207 mg/dL — ABNORMAL HIGH (ref 70–99)
Potassium: 3.7 mmol/L (ref 3.5–5.1)
Sodium: 141 mmol/L (ref 135–145)

## 2017-12-04 LAB — URINALYSIS, COMPLETE (UACMP) WITH MICROSCOPIC
BILIRUBIN URINE: NEGATIVE
Bacteria, UA: NONE SEEN
GLUCOSE, UA: 50 mg/dL — AB
HGB URINE DIPSTICK: NEGATIVE
KETONES UR: NEGATIVE mg/dL
LEUKOCYTES UA: NEGATIVE
Nitrite: NEGATIVE
PH: 6 (ref 5.0–8.0)
Protein, ur: 100 mg/dL — AB
SQUAMOUS EPITHELIAL / LPF: NONE SEEN (ref 0–5)
Specific Gravity, Urine: 1.018 (ref 1.005–1.030)

## 2017-12-04 LAB — CBC
HCT: 33.2 % — ABNORMAL LOW (ref 36.0–46.0)
Hemoglobin: 10.8 g/dL — ABNORMAL LOW (ref 12.0–15.0)
MCH: 27.1 pg (ref 26.0–34.0)
MCHC: 32.5 g/dL (ref 30.0–36.0)
MCV: 83.4 fL (ref 80.0–100.0)
NRBC: 0 % (ref 0.0–0.2)
PLATELETS: 200 10*3/uL (ref 150–400)
RBC: 3.98 MIL/uL (ref 3.87–5.11)
RDW: 14.6 % (ref 11.5–15.5)
WBC: 6.7 10*3/uL (ref 4.0–10.5)

## 2017-12-04 LAB — TROPONIN I: Troponin I: <0.03 ng/mL (ref <0.03)

## 2017-12-04 NOTE — ED Notes (Signed)
Up to BR. Niece at bedside and will take her home to [facility]

## 2017-12-04 NOTE — ED Notes (Signed)
In and out catheter performed with assistance from [place], ED Tech.

## 2017-12-04 NOTE — ED Notes (Signed)
Call report to [facility]

## 2017-12-04 NOTE — ED Triage Notes (Signed)
Pt brought in by [facility] due to syncope while eating lunch. She is coming from [employer]. She was found face down in her lunch and staff moved her to the floor. She started yawning per staff. She was alert to her normal status when EMS arrived. Pt is only alert to self. Hard of hearing and hears best in left ear. She was breathing normal when EMS arrived. CBG was 156 mg/dL per EMS.

## 2017-12-04 NOTE — Discharge Instructions (Addendum)
her blood pressure was a little bit elevated here, please recheck it when you get home. If she appears unwell to in any other way please return to the emergency department

## 2017-12-04 NOTE — ED Provider Notes (Signed)
[facility] Emergency Department Provider Note  ____________________________________________   I have reviewed the triage vital signs and the nursing notes. Where available I have reviewed prior notes and, if possible and indicated, outside hospital notes.    HISTORY  Chief Complaint Loss of Consciousness    HPI Tamara Simon is a 84 y.o. female  states that she "fell asleep", she states that she had a busy morning at the doctor's office which is been verified by EMS, she was at dinner and she "fell asleep sitting up". She did not fall. She was helped to the ground. No trauma. She denies any chest pain shows breath nausea vomiting abdominal pain numbness or weakness or other concerns. Family, at bedside, states she is at her baseline. Patient would prefer to go home. She did miss her evening medications, she states. a serious somewhat limited by patient baseline dementia. Past Medical History:  Diagnosis Date  . Arthritis   . Breast cancer of upper-outer quadrant of left female breast [facility]) March 2016   Triple negative  . Chronic kidney disease   . Dementia   . Diabetes mellitus without complication ([place] )   . GERD (gastroesophageal reflux disease)   . Hearing loss   . Hyperlipidemia   . Hypertension   . Left ventricular hypertrophy   . Paget's bone disease   . Thyroid disease     Patient Active Problem List   Diagnosis Date Noted  . Breast cancer ([place]) 09/20/2016  . Breast cancer of upper-outer quadrant of left female breast ([place]) 05/18/2014  . Pure hypercholesterolemia 04/29/2014  . DM (diabetes mellitus) type II controlled, neurological manifestation ([place]) 09/19/2013  . Benign essential hypertension 09/19/2013  . GERD (gastroesophageal reflux disease) 09/19/2013  . Hypothyroid 09/19/2013  . LVH (left ventricular hypertrophy) due to hypertensive disease 09/19/2013  . Paget's bone disease 09/19/2013    Past Surgical History:  Procedure  Laterality Date  . HERNIA REPAIR    . partial mastectomy left breast  07/08/2014  . THYROID SURGERY  84 years old    Prior to Admission medications   Medication Sig Start Date End Date Taking? Authorizing Provider  amLODipine (NORVASC) 5 MG tablet Take 5 mg by mouth daily. 04/16/14   [provider]  aspirin 81 MG tablet Take 81 mg by mouth daily.    [provider]  calcium-vitamin D (OSCAL WITH D) 500-200 MG-UNIT tablet Take 1 tablet by mouth 2 (two) times daily.    [provider]  docusate sodium (COLACE) 100 MG capsule Take 100 mg by mouth daily as needed.  07/01/14   [provider]  glimepiride (AMARYL) 1 MG tablet Take 1 mg by mouth daily with breakfast.    [provider]  iron polysaccharides (NIFEREX) 150 MG capsule Take 150 mg by mouth daily.  11/02/15 01/27/17  [provider]  levothyroxine (SYNTHROID, LEVOTHROID) 125 MCG tablet Take 125 mcg by mouth daily before breakfast.  03/09/14   [provider]  loratadine (CLARITIN) 10 MG tablet Take 10 mg by mouth daily as needed for allergies.    [provider]  losartan-hydrochlorothiazide (HYZAAR) 50-12.5 MG tablet Take 1 tablet by mouth daily. 06/12/16 06/12/17  [provider]  memantine (NAMENDA) 5 MG tablet Take 5 mg by mouth 2 (two) times daily.     [provider]  polyvinyl alcohol (LIQUIFILM TEARS) 1.4 % ophthalmic solution Place 1 drop into the left eye 4 (four) times daily as needed for dry eyes.  [provider]  QUEtiapine (SEROQUEL) 25 MG tablet Take 12.5 mg by mouth at bedtime.    [provider]  traMADol (ULTRAM) 50 MG tablet Take 50 mg by mouth daily as needed for moderate pain.  07/01/14   [provider]    Allergies Carvedilol; Clonidine; Spironolactone; and Terazosin  Family History  Problem Relation Age of Onset  . Breast cancer Sister     Social History Social History   Tobacco Use  . Smoking  status: Never Smoker  . Smokeless tobacco: Never Used  Substance Use Topics  . Alcohol use: Yes    Alcohol/week: 0.0 standard drinks    Comment: wine at times- none since being in [facility] living  . Drug use: No    Review of Systems Constitutional: No fever/chills Eyes: No visual changes. ENT: No sore throat. No stiff neck no neck pain Cardiovascular: Denies chest pain. Respiratory: Denies shortness of breath. Gastrointestinal:   no vomiting.  No diarrhea.  No constipation. Genitourinary: Negative for dysuria. Musculoskeletal: Negative lower extremity swelling Skin: Negative for rash. Neurological: Negative for severe headaches, focal weakness or numbness.   ____________________________________________   PHYSICAL EXAM:  VITAL SIGNS: ED Triage Vitals  Enc Vitals Group     BP 12/04/17 1508 (!) 160/52     Pulse Rate 12/04/17 1508 72     Resp 12/04/17 1508 15     Temp 12/04/17 1508 97.9 F (36.6 C)     Temp Source 12/04/17 1508 Oral     SpO2 12/04/17 1508 100 %     Weight 12/04/17 1509 119 lb (54 kg)     Height 12/04/17 1509 5\' 1"  (1.549 m)     Head Circumference --      Peak Flow --      Pain Score --      Pain Loc --      Pain Edu? --      Excl. in [place]? --     Constitutional: Alert and orientedthe name and place unsure of the date pleasantly demented. Well appearing and in no acute distress. Eyes: Conjunctivae are normal Head: Atraumatic HEENT: No congestion/rhinnorhea. Mucous membranes are moist.  Oropharynx non-erythematous Neck:   Nontender with no meningismus, no masses, no stridor Cardiovascular: Normal rate, regular rhythm. Grossly normal heart sounds.  Good peripheral circulation. Respiratory: Normal respiratory effort.  No retractions. Lungs CTAB. Abdominal: Soft and nontender. No distention. No guarding no rebound Back:  There is no focal tenderness or step off.  there is no midline tenderness there are no lesions noted. there is no CVA  tenderness Musculoskeletal: No lower extremity tenderness, no upper extremity tenderness. No joint effusions, no DVT signs strong distal pulses no edema Neurologic:  Normal speech and language. No gross focal neurologic deficits are appreciated.  Skin:  Skin is warm, dry and intact. No rash noted. Psychiatric: Mood and affect are normal. Speech and behavior are normal.  ____________________________________________   LABS (all labs ordered are listed, but only abnormal results are displayed)  Labs Reviewed  BASIC METABOLIC PANEL - Abnormal; Notable for the following components:      Result Value   Glucose, Bld 207 (*)    BUN 25 (*)    Creatinine, Ser 1.04 (*)    GFR calc non Af Amer 46 (*)    GFR calc Af Amer 53 (*)    All other components within normal limits  CBC - Abnormal; Notable for the following components:   Hemoglobin 10.8 (*)  HCT 33.2 (*)    All other components within normal limits  URINALYSIS, COMPLETE (UACMP) WITH MICROSCOPIC - Abnormal; Notable for the following components:   Color, Urine YELLOW (*)    APPearance CLEAR (*)    Glucose, UA 50 (*)    Protein, ur 100 (*)    All other components within normal limits  TROPONIN I    Pertinent labs  results that were available during my care of the patient were reviewed by me and considered in my medical decision making (see chart for details). ____________________________________________  EKG  I personally interpreted any EKGs ordered by me or triage normal sinus rhythm rate 64 bpm no acute ST elevation or depression nonspecificST changes. ____________________________________________  RADIOLOGY  Pertinent labs & imaging results that were available during my care of the patient were reviewed by me and considered in my medical decision making (see chart for details). If possible, patient and/or family made aware of any abnormal findings.  No results found. ____________________________________________     PROCEDURES  Procedure(s) performed: None  Procedures  Critical Care performed: None  ____________________________________________   INITIAL IMPRESSION / ASSESSMENT AND PLAN / ED COURSE  Pertinent labs & imaging results that were available during my care of the patient were reviewed by me and considered in my medical decision making (see chart for details).  patient had a syncopal-like episode today, no evidence of trauma, neurologically intact, patient with no complaints and eager to go  back to her  nursing home. We will check basic workup to ensure were not missing anything acute. Blood pressure somewhat, however, there is no chest pain or shortness of breath  ----------------------------------------- 6:42 PM on 12/04/2017 -----------------------------------------  W/u negative here. Will d.c after d/w family.    ____________________________________________   FINAL CLINICAL IMPRESSION(S) / ED DIAGNOSES  Final diagnoses:  None      This chart was dictated using voice recognition software.  Despite best efforts to proofread,  errors can occur which can change meaning.      Schuyler Amor, MD 12/04/17 [phone]

## 2018-03-27 ENCOUNTER — Ambulatory Visit: Payer: Medicare Other | Admitting: Oncology

## 2018-03-31 ENCOUNTER — Inpatient Hospital Stay: Payer: Medicare Other | Attending: Oncology | Admitting: Oncology

## 2018-03-31 VITALS — BP 221/69 | HR 66 | Temp 97.8°F | Resp 18 | Wt 115.2 lb

## 2018-03-31 DIAGNOSIS — Z9012 Acquired absence of left breast and nipple: Secondary | ICD-10-CM | POA: Diagnosis not present

## 2018-03-31 DIAGNOSIS — Z853 Personal history of malignant neoplasm of breast: Secondary | ICD-10-CM | POA: Diagnosis not present

## 2018-03-31 DIAGNOSIS — Z79899 Other long term (current) drug therapy: Secondary | ICD-10-CM | POA: Diagnosis not present

## 2018-03-31 DIAGNOSIS — C50912 Malignant neoplasm of unspecified site of left female breast: Secondary | ICD-10-CM | POA: Insufficient documentation

## 2018-03-31 DIAGNOSIS — Z7984 Long term (current) use of oral hypoglycemic drugs: Secondary | ICD-10-CM | POA: Diagnosis not present

## 2018-03-31 DIAGNOSIS — Z923 Personal history of irradiation: Secondary | ICD-10-CM | POA: Diagnosis not present

## 2018-03-31 DIAGNOSIS — E119 Type 2 diabetes mellitus without complications: Secondary | ICD-10-CM | POA: Insufficient documentation

## 2018-03-31 DIAGNOSIS — F0390 Unspecified dementia without behavioral disturbance: Secondary | ICD-10-CM | POA: Diagnosis not present

## 2018-03-31 DIAGNOSIS — Z7982 Long term (current) use of aspirin: Secondary | ICD-10-CM | POA: Diagnosis not present

## 2018-03-31 DIAGNOSIS — Z171 Estrogen receptor negative status [ER-]: Secondary | ICD-10-CM | POA: Diagnosis not present

## 2018-03-31 DIAGNOSIS — I10 Essential (primary) hypertension: Secondary | ICD-10-CM | POA: Diagnosis not present

## 2018-03-31 DIAGNOSIS — Z08 Encounter for follow-up examination after completed treatment for malignant neoplasm: Secondary | ICD-10-CM

## 2018-03-31 NOTE — Progress Notes (Signed)
Patient had UTI the end of January.  Eating well.  No N/V.  BP elevated today 221/60 HR 66 O2 100%.    Patient brought in by her cousin, Kevan Ny.

## 2018-04-03 ENCOUNTER — Encounter: Payer: Self-pay | Admitting: Oncology

## 2018-04-03 ENCOUNTER — Telehealth: Payer: Self-pay | Admitting: *Deleted

## 2018-04-03 NOTE — Progress Notes (Signed)
Hematology/Oncology Consult note [facility]  Telephone:([phone] [phone]  Patient Care Team: Tracie Harrier, MD as PCP - General (Internal Medicine) Bary Castilla Forest Gleason, MD (General Surgery)   Name of the patient: Tamara Simon  [id number]  September 28, 1924   Date of visit: 04/03/18  Diagnosis- triple negative left breast cancer s/p lumpectomy and RT  Chief complaint/ Reason for visit- routine f/u of breast cancer  Heme/Onc history: patient is a 85 year old female who was diagnosed with triple negative left breast cancer in March 2016. Her oncology history is as follows:  04/2014: She self-palpated a left breast mass while bathing.  05/09/14: Ultrasound examination of left breast mass showed an irregularly marginated hypoechoic mass with some posterior acoustic enhancement measuring 1.66 x 1.72 x 1.80 cm. This did not extend to the overlying dermis. BI-RADS-4.   Left breast core biopsy of this mass confirmed grade 3 invasive ductal carcinoma, measuring 0.7 cm, with no DCIS. Receptor status ER/PR/HER2 negative.  06/16/14: Additional imaging at [facility] showed no abnormalities in the contralateral right breast, and no left axillary lymphadenopathy. The index lesion measured 2.5 x 3 cm in the left breast upper outer quadrant.  07/08/14: Left partial mastectomy: 2.5 cm, grade 3, LVI positive, lymph node not evaluated, clear margin invasive ductal carcinoma.  09/14/14: Completed adjuvant radiation therapy (Canadian fractionation plus electron boost), 4272 cGy + 1000 cGy  Patient did not get any adjuvant chemotherapy due to her age anf frailty   Interval history- she is here with her niece who reports that she is doing well overall but her dementia is getting worse. She has trouble recollecting things and at times combative. She denies any pain today  ECOG PS- 1 Pain scale- 0   Review of systems- Review of Systems  Constitutional: Positive for  malaise/fatigue. Negative for chills, fever and weight loss.  HENT: Negative for congestion, ear discharge and nosebleeds.   Eyes: Negative for blurred vision.  Respiratory: Negative for cough, hemoptysis, sputum production, shortness of breath and wheezing.   Cardiovascular: Negative for chest pain, palpitations, orthopnea and claudication.  Gastrointestinal: Negative for abdominal pain, blood in stool, constipation, diarrhea, heartburn, melena, nausea and vomiting.  Genitourinary: Negative for dysuria, flank pain, frequency, hematuria and urgency.  Musculoskeletal: Negative for back pain, joint pain and myalgias.  Skin: Negative for rash.  Neurological: Negative for dizziness, tingling, focal weakness, seizures, weakness and headaches.  Endo/Heme/Allergies: Does not bruise/bleed easily.  Psychiatric/Behavioral: Negative for depression and suicidal ideas. The patient does not have insomnia.       Allergies  Allergen Reactions  . Carvedilol Other (See Comments)    Insomnia  . Clonidine Other (See Comments)    syncope  . Spironolactone Other (See Comments)    unknown  . Terazosin Other (See Comments)    unknown     Past Medical History:  Diagnosis Date  . Arthritis   . Breast cancer of upper-outer quadrant of left female breast [facility]) March 2016   Triple negative  . Chronic kidney disease   . Dementia   . Diabetes mellitus without complication ([place])   . GERD (gastroesophageal reflux disease)   . Hearing loss   . Hyperlipidemia   . Hypertension   . Left ventricular hypertrophy   . Paget's bone disease   . Thyroid disease      Past Surgical History:  Procedure Laterality Date  . HERNIA REPAIR    . partial mastectomy left breast  07/08/2014  . THYROID SURGERY  85 years old    Social History   Socioeconomic History  . Marital status: Widowed    Spouse name: Not on file  . Number of children: Not on file  . Years of education: Not on file  . Highest education  level: Not on file  Occupational History  . Not on file  Social Needs  . Financial resource strain: Not on file  . Food insecurity:    Worry: Not on file    Inability: Not on file  . Transportation needs:    Medical: Not on file    Non-medical: Not on file  Tobacco Use  . Smoking status: Never Smoker  . Smokeless tobacco: Never Used  Substance and Sexual Activity  . Alcohol use: Yes    Alcohol/week: 0.0 standard drinks    Comment: wine at times- none since being in asst living  . Drug use: No  . Sexual activity: Not on file  Lifestyle  . Physical activity:    Days per week: Not on file    Minutes per session: Not on file  . Stress: Not on file  Relationships  . Social connections:    Talks on phone: Not on file    Gets together: Not on file    Attends religious service: Not on file    Active member of club or organization: Not on file    Attends meetings of clubs or organizations: Not on file    Relationship status: Not on file  . Intimate partner violence:    Fear of current or ex partner: Not on file    Emotionally abused: Not on file    Physically abused: Not on file    Forced sexual activity: Not on file  Other Topics Concern  . Not on file  Social History Narrative  . Not on file    Family History  Problem Relation Age of Onset  . Breast cancer Sister      Current Outpatient Medications:  .  amLODipine (NORVASC) 5 MG tablet, Take 5 mg by mouth daily., Disp: , Rfl:  .  aspirin 81 MG tablet, Take 81 mg by mouth daily., Disp: , Rfl:  .  docusate sodium (COLACE) 100 MG capsule, Take 100 mg by mouth daily as needed. , Disp: , Rfl:  .  glimepiride (AMARYL) 1 MG tablet, Take 1 mg by mouth daily with breakfast., Disp: , Rfl:  .  levothyroxine (SYNTHROID, LEVOTHROID) 125 MCG tablet, Take 125 mcg by mouth daily before breakfast. , Disp: , Rfl:  .  loratadine (CLARITIN) 10 MG tablet, Take 10 mg by mouth daily as needed for allergies., Disp: , Rfl:  .  losartan  (COZAAR) 50 MG tablet, Take 50 mg by mouth daily., Disp: , Rfl:  .  memantine (NAMENDA) 5 MG tablet, Take 5 mg by mouth 2 (two) times daily. , Disp: , Rfl:  .  polyvinyl alcohol (LIQUIFILM TEARS) 1.4 % ophthalmic solution, Place 1 drop into the left eye 4 (four) times daily as needed for dry eyes., Disp: , Rfl:  .  QUEtiapine (SEROQUEL) 25 MG tablet, Take 12.5 mg by mouth at bedtime., Disp: , Rfl:  .  sertraline (ZOLOFT) 50 MG tablet, Take 50 mg by mouth daily., Disp: , Rfl:  .  traMADol (ULTRAM) 50 MG tablet, Take 50 mg by mouth daily as needed for moderate pain. , Disp: , Rfl:  .  Calcium Carbonate-Vit D-Min (CALTRATE 600+D PLUS MINERALS PO), Take 1 tablet by mouth daily., Disp: ,  Rfl:  .  calcium-vitamin D (OSCAL WITH D) 500-200 MG-UNIT tablet, Take 1 tablet by mouth 2 (two) times daily., Disp: , Rfl:  .  iron polysaccharides (NIFEREX) 150 MG capsule, Take 150 mg by mouth daily. , Disp: , Rfl:  .  losartan-hydrochlorothiazide (HYZAAR) 50-12.5 MG tablet, Take 1 tablet by mouth daily., Disp: , Rfl:   Physical exam:  Vitals:   03/31/18 1523  BP: (!) 221/69  Pulse: 66  Resp: 18  Temp: 97.8 F (36.6 C)  TempSrc: Tympanic  SpO2: 100%  Weight: 115 lb 4 oz (52.3 kg)   Physical Exam Constitutional:      Comments: Thin elderly lady in no acute distress  HENT:     Head: Normocephalic and atraumatic.  Eyes:     Pupils: Pupils are equal, round, and reactive to light.  Neck:     Musculoskeletal: Normal range of motion.  Cardiovascular:     Rate and Rhythm: Normal rate and regular rhythm.     Heart sounds: Normal heart sounds.  Pulmonary:     Effort: Pulmonary effort is normal.     Breath sounds: Normal breath sounds.  Abdominal:     General: Bowel sounds are normal.     Palpations: Abdomen is soft.  Skin:    General: Skin is warm and dry.  Neurological:     Mental Status: She is alert.     Comments: Oriented to self    Breast exam was performed in seated and lying down  position. Patient is status post left lumpectomy with a well-healed surgical scar. No evidence of any palpable masses. No evidence of axillary adenopathy. No evidence of any palpable masses or lumps in the right breast. No evidence of right axillary adenopathy   CMP Latest Ref Rng & Units 12/04/2017  Glucose 70 - 99 mg/dL 207(H)  BUN 8 - 23 mg/dL 25(H)  Creatinine 0.44 - 1.00 mg/dL 1.04(H)  Sodium 135 - 145 mmol/L 141  Potassium 3.5 - 5.1 mmol/L 3.7  Chloride 98 - 111 mmol/L 107  CO2 22 - 32 mmol/L 28  Calcium 8.9 - 10.3 mg/dL 9.2  Total Protein 6.5 - 8.1 g/dL -  Total Bilirubin 0.3 - 1.2 mg/dL -  Alkaline Phos 38 - 126 U/L -  AST 15 - 41 U/L -  ALT 14 - 54 U/L -   CBC Latest Ref Rng & Units 12/04/2017  WBC 4.0 - 10.5 K/uL 6.7  Hemoglobin 12.0 - 15.0 g/dL 10.8(L)  Hematocrit 36.0 - 46.0 % 33.2(L)  Platelets 150 - 400 K/uL 200    No images are attached to the encounter.  No results found.   Assessment and plan- Patient is a 85 y.o. female female with a history of stage II triple negative left breast cancer diagnosed in March 2016 status post lumpectomy and adjuvant radiation. She is ere for routine breast cancer surveillance  Clinically patient is doing well and there is no evidence of recurrence on today's exam.  Given her age frailty and ongoing dementia we are not doing any screening mammograms at this time.  I will see her back in 1 years time for routine physical.  She does have elevated blood pressure today in the office and we will touch base with Dr. Ginette Pitman regarding this as well   Visit Diagnosis 1. Encounter for follow-up surveillance of breast cancer      Dr. Randa Evens, MD, MPH [facility] [phone] 04/03/2018 7:05 AM

## 2018-04-03 NOTE — Telephone Encounter (Signed)
Call Dr. Linton Ham office spoke to secretary to let her know that on this patient Tamara Simon.  Our office only sees her once a year but each time her blood pressure is elevated and she does take several blood pressure pills.  Dr. Judithann Graves just wanted Dr. Patsy Lager to have information that the blood pressure is always up and whether or not he has that problem in his office when the patient is seen.  If any intervention is needed based on her medications and her blood pressure values

## 2018-04-29 ENCOUNTER — Ambulatory Visit: Payer: Medicare Other | Admitting: Psychiatry

## 2018-07-29 ENCOUNTER — Encounter: Payer: Self-pay | Admitting: *Deleted

## 2018-07-29 ENCOUNTER — Other Ambulatory Visit: Payer: Self-pay

## 2018-07-30 NOTE — Discharge Instructions (Signed)

## 2018-07-31 ENCOUNTER — Other Ambulatory Visit: Payer: Medicare Other

## 2018-08-05 ENCOUNTER — Encounter
Admission: RE | Disposition: A | Discharge status: Home / Self Care | Payer: Self-pay | Source: Home / Self Care | Attending: Ophthalmology

## 2018-08-05 ENCOUNTER — Other Ambulatory Visit: Payer: Self-pay

## 2018-08-05 ENCOUNTER — Other Ambulatory Visit
Admission: RE | Admit: 2018-08-05 | Discharge: 2018-08-05 | Disposition: A | Discharge status: Home / Self Care | Payer: Medicare Other | Source: Ambulatory Visit | Attending: Ophthalmology | Admitting: Ophthalmology

## 2018-08-05 ENCOUNTER — Ambulatory Visit: Payer: Medicare Other | Admitting: Anesthesiology

## 2018-08-05 ENCOUNTER — Ambulatory Visit: Admit: 2018-08-05 | Payer: Medicare Other | Admitting: Ophthalmology

## 2018-08-05 ENCOUNTER — Encounter: Payer: Self-pay | Admitting: *Deleted

## 2018-08-05 ENCOUNTER — Ambulatory Visit
Admission: RE | Admit: 2018-08-05 | Discharge: 2018-08-05 | Disposition: A | Discharge status: Home / Self Care | Payer: Medicare Other | Attending: Ophthalmology | Admitting: Ophthalmology

## 2018-08-05 DIAGNOSIS — I10 Essential (primary) hypertension: Secondary | ICD-10-CM | POA: Diagnosis not present

## 2018-08-05 DIAGNOSIS — F0390 Unspecified dementia without behavioral disturbance: Secondary | ICD-10-CM | POA: Diagnosis not present

## 2018-08-05 DIAGNOSIS — E7800 Pure hypercholesterolemia, unspecified: Secondary | ICD-10-CM | POA: Insufficient documentation

## 2018-08-05 DIAGNOSIS — H5703 Miosis: Secondary | ICD-10-CM | POA: Diagnosis not present

## 2018-08-05 DIAGNOSIS — Z1159 Encounter for screening for other viral diseases: Secondary | ICD-10-CM | POA: Diagnosis not present

## 2018-08-05 DIAGNOSIS — Z9010 Acquired absence of unspecified breast and nipple: Secondary | ICD-10-CM | POA: Diagnosis not present

## 2018-08-05 DIAGNOSIS — Z888 Allergy status to other drugs, medicaments and biological substances status: Secondary | ICD-10-CM | POA: Insufficient documentation

## 2018-08-05 DIAGNOSIS — H2511 Age-related nuclear cataract, right eye: Secondary | ICD-10-CM | POA: Diagnosis present

## 2018-08-05 DIAGNOSIS — E039 Hypothyroidism, unspecified: Secondary | ICD-10-CM | POA: Diagnosis not present

## 2018-08-05 DIAGNOSIS — M1990 Unspecified osteoarthritis, unspecified site: Secondary | ICD-10-CM | POA: Insufficient documentation

## 2018-08-05 DIAGNOSIS — F329 Major depressive disorder, single episode, unspecified: Secondary | ICD-10-CM | POA: Insufficient documentation

## 2018-08-05 DIAGNOSIS — E1136 Type 2 diabetes mellitus with diabetic cataract: Secondary | ICD-10-CM | POA: Diagnosis not present

## 2018-08-05 DIAGNOSIS — H9193 Unspecified hearing loss, bilateral: Secondary | ICD-10-CM | POA: Diagnosis not present

## 2018-08-05 DIAGNOSIS — E785 Hyperlipidemia, unspecified: Secondary | ICD-10-CM | POA: Insufficient documentation

## 2018-08-05 DIAGNOSIS — K219 Gastro-esophageal reflux disease without esophagitis: Secondary | ICD-10-CM | POA: Insufficient documentation

## 2018-08-05 HISTORY — PX: CATARACT EXTRACTION W/PHACO: SHX586

## 2018-08-05 LAB — GLUCOSE, CAPILLARY
Glucose-Capillary: 82 mg/dL (ref 70–99)
Glucose-Capillary: 81 mg/dL (ref 70–99)

## 2018-08-05 LAB — SARS CORONAVIRUS 2 BY RT PCR (HOSPITAL ORDER, PERFORMED IN ~~LOC~~ HOSPITAL LAB): SARS Coronavirus 2: NEGATIVE

## 2018-08-05 SURGERY — PHACOEMULSIFICATION, CATARACT, WITH IOL INSERTION
Anesthesia: Topical | Laterality: Right

## 2018-08-05 SURGERY — PHACOEMULSIFICATION, CATARACT, WITH IOL INSERTION
Anesthesia: Monitor Anesthesia Care | Site: Eye | Laterality: Right

## 2018-08-05 MED ORDER — EPINEPHRINE PF 1 MG/ML IJ SOLN
INTRAOCULAR | Status: DC | PRN
  Administered 2018-08-05: 73 mL via OPHTHALMIC

## 2018-08-05 MED ORDER — NA HYALUR & NA CHOND-NA HYALUR 0.4-0.35 ML IO KIT
PACK | INTRAOCULAR | Status: DC | PRN
  Administered 2018-08-05: 1 mL via INTRAOCULAR

## 2018-08-05 MED ORDER — DIPHENHYDRAMINE HCL 50 MG/ML IJ SOLN
INTRAMUSCULAR | Status: DC | PRN
  Administered 2018-08-05: 12.5 mg via INTRAVENOUS

## 2018-08-05 MED ORDER — TETRACAINE HCL 0.5 % OP SOLN
1.0000 [drp] | OPHTHALMIC | Status: DC | PRN
  Administered 2018-08-05 (×3): 1 [drp] via OPHTHALMIC

## 2018-08-05 MED ORDER — BRIMONIDINE TARTRATE-TIMOLOL 0.2-0.5 % OP SOLN
OPHTHALMIC | Status: DC | PRN
  Administered 2018-08-05: 1 [drp] via OPHTHALMIC

## 2018-08-05 MED ORDER — MOXIFLOXACIN HCL 0.5 % OP SOLN
1.0000 [drp] | OPHTHALMIC | Status: DC | PRN
  Administered 2018-08-05 (×3): 1 [drp] via OPHTHALMIC

## 2018-08-05 MED ORDER — DEXMEDETOMIDINE HCL 200 MCG/2ML IV SOLN
INTRAVENOUS | Status: DC | PRN
  Administered 2018-08-05: 4 ug via INTRAVENOUS
  Administered 2018-08-05: 8 ug via INTRAVENOUS

## 2018-08-05 MED ORDER — CEFUROXIME OPHTHALMIC INJECTION 1 MG/0.1 ML
INJECTION | OPHTHALMIC | Status: DC | PRN
  Administered 2018-08-05: 0.1 mL via INTRACAMERAL

## 2018-08-05 MED ORDER — LIDOCAINE HCL (PF) 2 % IJ SOLN
INTRAOCULAR | Status: DC | PRN
  Administered 2018-08-05: 11:00:00 2 mL

## 2018-08-05 MED ORDER — ARMC OPHTHALMIC DILATING DROPS
1.0000 "application " | OPHTHALMIC | Status: DC | PRN
  Administered 2018-08-05 (×3): 1 via OPHTHALMIC

## 2018-08-05 SURGICAL SUPPLY — 21 items
CANNULA ANT/CHMB 27GA (MISCELLANEOUS) ×3 IMPLANT
GLOVE SURG LX 7.5 STRW (GLOVE) ×2
GLOVE SURG LX STRL 7.5 STRW (GLOVE) ×1 IMPLANT
GLOVE SURG TRIUMPH 8.0 PF LTX (GLOVE) ×3 IMPLANT
GOWN STRL REUS W/ TWL LRG LVL3 (GOWN DISPOSABLE) ×2 IMPLANT
GOWN STRL REUS W/TWL LRG LVL3 (GOWN DISPOSABLE) ×4
LENS IOL TECNIS ITEC 23.0 (Intraocular Lens) ×3 IMPLANT
MARKER SKIN DUAL TIP RULER LAB (MISCELLANEOUS) ×3 IMPLANT
NEEDLE FILTER BLUNT 18X 1/2SAF (NEEDLE) ×2
NEEDLE FILTER BLUNT 18X1 1/2 (NEEDLE) ×1 IMPLANT
PACK CATARACT BRASINGTON (MISCELLANEOUS) ×3 IMPLANT
PACK EYE AFTER SURG (MISCELLANEOUS) ×3 IMPLANT
PACK OPTHALMIC (MISCELLANEOUS) ×3 IMPLANT
RING MALYGIN (MISCELLANEOUS) ×3 IMPLANT
SUT ETHILON 10-0 CS-B-6CS-B-6 (SUTURE) ×3
SUTURE EHLN 10-0 CS-B-6CS-B-6 (SUTURE) ×1 IMPLANT
SYR 3ML LL SCALE MARK (SYRINGE) ×3 IMPLANT
SYR 5ML LL (SYRINGE) ×3 IMPLANT
SYR TB 1ML LUER SLIP (SYRINGE) ×3 IMPLANT
WATER STERILE IRR 500ML POUR (IV SOLUTION) ×3 IMPLANT
WIPE NON LINTING 3.25X3.25 (MISCELLANEOUS) ×3 IMPLANT

## 2018-08-05 NOTE — Anesthesia Preprocedure Evaluation (Addendum)
Anesthesia Evaluation  Patient identified by MRN, date of birth, ID band Patient awake    Reviewed: Allergy & Precautions, NPO status , Patient's Chart, lab work & pertinent test results  Airway Mallampati: II  TM Distance: >3 FB     Dental   Pulmonary    breath sounds clear to auscultation       Cardiovascular hypertension,  Rhythm:Regular Rate:Normal  HLD   Neuro/Psych Dementia    GI/Hepatic GERD  ,  Endo/Other  diabetesHypothyroidism   Renal/GU Renal InsufficiencyRenal disease     Musculoskeletal  (+) Arthritis ,   Abdominal   Peds  Hematology   Anesthesia Other Findings   Reproductive/Obstetrics                             Anesthesia Physical Anesthesia Plan  ASA: III  Anesthesia Plan: MAC   Post-op Pain Management:    Induction: Intravenous  PONV Risk Score and Plan:   Airway Management Planned: Natural Airway and Nasal Cannula  Additional Equipment:   Intra-op Plan:   Post-operative Plan:   Informed Consent: I have reviewed the patients History and Physical, chart, labs and discussed the procedure including the risks, benefits and alternatives for the proposed anesthesia with the patient or authorized representative who has indicated his/her understanding and acceptance.     Plan/risks discussed with: Consent by POA.  Plan Discussed with: CRNA  Anesthesia Plan Comments:        Anesthesia Quick Evaluation

## 2018-08-05 NOTE — H&P (Signed)

## 2018-08-05 NOTE — Anesthesia Postprocedure Evaluation (Signed)
Anesthesia Post Note  Patient: Tamara Simon  Procedure(s) Performed: CATARACT EXTRACTION PHACO AND INTRAOCULAR LENS PLACEMENT (IOC)  RIGHT DIABETIC (Right Eye)  Patient location during evaluation: PACU Anesthesia Type: MAC Level of consciousness: awake and alert Pain management: pain level controlled Vital Signs Assessment: post-procedure vital signs reviewed and stable Respiratory status: spontaneous breathing, nonlabored ventilation, respiratory function stable and patient connected to nasal cannula oxygen Cardiovascular status: stable and blood pressure returned to baseline Postop Assessment: no apparent nausea or vomiting Anesthetic complications: no    Veda Canning

## 2018-08-05 NOTE — Op Note (Signed)
OPERATIVE NOTE  Tamara Simon [id number] 08/05/2018   PREOPERATIVE DIAGNOSIS:    Nuclear Sclerotic Cataract Right eye with miotic pupil.        H25.11  POSTOPERATIVE DIAGNOSIS: Nuclear Sclerotic Cataract Right eye with miotic pupil.          PROCEDURE:  Phacoemusification with posterior chamber intraocular lens placement of the right eye which required pupil stretching with the Malyugin pupil expansion device.  LENS:   Implant Name Type Inv. Item Serial No. Manufacturer Lot No. LRB No. Used Action  LENS IOL DIOP 23.0 - [id number] Intraocular Lens LENS IOL DIOP 23.0 [id number]  Right 1 Implanted       ULTRASOUND TIME: 12 % of 1 minutes 48 seconds, CDE 12.9  SURGEON:  Wyonia Hough, MD   ANESTHESIA:  Topical with tetracaine drops and 2% Xylocaine jelly, augmented with 1% preservative-free intracameral lidocaine.   COMPLICATIONS:  None.   DESCRIPTION OF PROCEDURE:  The patient was identified in the holding room and transported to the operating room and placed in the supine position under the operating microscope. Theright eye was identified as the operative eye and it was prepped and draped in the usual sterile ophthalmic fashion.   A 1 millimeter clear-corneal paracentesis was made at the 12:00 position.  0.5 ml of preservative-free 1% lidocaine was injected into the anterior chamber. The anterior chamber was filled with Viscoat viscoelastic.  A 2.4 millimeter keratome was used to make a near-clear corneal incision at the 9:00 position. A Malyugin pupil expander was then placed through the main incision and into the anterior chamber of the eye.  The edge of the iris was secured on the lip of the pupil expander and it was released, thereby expanding the pupil to approximately 6.5 millimeters for completion of the cataract surgery.  Additional Viscoat was placed in the anterior chamber.  A cystotome and capsulorrhexis forceps were used to make a curvilinear capsulorrhexis.    Balanced salt solution was used to hydrodissect and hydrodelineate the lens nucleus.   Phacoemulsification was used in stop and chop fashion to remove the lens, nucleus and epinucleus.  The remaining cortex was aspirated using the irrigation aspiration handpiece.  Additional Provisc was placed into the eye to distend the capsular bag for lens placement.  A lens was then injected into the capsular bag.  The pupil expanding ring was removed using a Kuglen hook and insertion device. The remaining viscoelastic was aspirated from the capsular bag and the anterior chamber.  The anterior chamber was filled with balanced salt solution to inflate to a physiologic pressure.  Wounds were hydrated with balanced salt solution.  The anterior chamber was inflated to a physiologic pressure with balanced salt solution.  No wound leaks were noted.Cefuroxime 0.1 ml of a 10mg /ml solution was injected into the anterior chamber for a dose of 1 mg of intracameral antibiotic at the completion of the case. Timolol and Brimonidine drops were applied to the eye. the eye was shielded.  The patient was taken to the recovery room in stable condition without complications of anesthesia or surgery.  [name] 08/05/2018, 11:34 AM

## 2018-08-05 NOTE — Anesthesia Procedure Notes (Signed)
Procedure Name: MAC Performed by: Izetta Dakin, CRNA Pre-anesthesia Checklist: Timeout performed, Patient being monitored, Suction available, Emergency Drugs available and Patient identified Patient Re-evaluated:Patient Re-evaluated prior to induction Oxygen Delivery Method: Nasal cannula

## 2018-08-05 NOTE — Transfer of Care (Signed)
Immediate Anesthesia Transfer of Care Note  Patient: Tamara Simon  Procedure(s) Performed: CATARACT EXTRACTION PHACO AND INTRAOCULAR LENS PLACEMENT (IOC)  RIGHT DIABETIC (Right Eye)  Patient Location: PACU  Anesthesia Type: MAC  Level of Consciousness: awake, alert  and patient cooperative  Airway and Oxygen Therapy: Patient Spontanous Breathing and Patient connected to supplemental oxygen  Post-op Assessment: Post-op Vital signs reviewed, Patient's Cardiovascular Status Stable, Respiratory Function Stable, Patent Airway and No signs of Nausea or vomiting  Post-op Vital Signs: Reviewed and stable  Complications: No apparent anesthesia complications

## 2018-08-06 ENCOUNTER — Encounter: Payer: Self-pay | Admitting: Ophthalmology

## 2018-10-19 ENCOUNTER — Encounter: Payer: Self-pay | Admitting: Emergency Medicine

## 2018-10-19 ENCOUNTER — Other Ambulatory Visit: Payer: Self-pay

## 2018-10-19 ENCOUNTER — Emergency Department
Admission: EM | Admit: 2018-10-19 | Discharge: 2018-10-20 | Disposition: A | Discharge status: Home / Self Care | Payer: Medicare Other | Attending: Emergency Medicine | Admitting: Emergency Medicine

## 2018-10-19 ENCOUNTER — Emergency Department: Payer: Medicare Other

## 2018-10-19 DIAGNOSIS — Z79899 Other long term (current) drug therapy: Secondary | ICD-10-CM | POA: Diagnosis not present

## 2018-10-19 DIAGNOSIS — E039 Hypothyroidism, unspecified: Secondary | ICD-10-CM | POA: Insufficient documentation

## 2018-10-19 DIAGNOSIS — I129 Hypertensive chronic kidney disease with stage 1 through stage 4 chronic kidney disease, or unspecified chronic kidney disease: Secondary | ICD-10-CM | POA: Diagnosis not present

## 2018-10-19 DIAGNOSIS — F0390 Unspecified dementia without behavioral disturbance: Secondary | ICD-10-CM | POA: Insufficient documentation

## 2018-10-19 DIAGNOSIS — W19XXXA Unspecified fall, initial encounter: Secondary | ICD-10-CM | POA: Insufficient documentation

## 2018-10-19 DIAGNOSIS — Z043 Encounter for examination and observation following other accident: Secondary | ICD-10-CM | POA: Insufficient documentation

## 2018-10-19 DIAGNOSIS — N189 Chronic kidney disease, unspecified: Secondary | ICD-10-CM | POA: Diagnosis not present

## 2018-10-19 DIAGNOSIS — E1122 Type 2 diabetes mellitus with diabetic chronic kidney disease: Secondary | ICD-10-CM | POA: Insufficient documentation

## 2018-10-19 DIAGNOSIS — Z7982 Long term (current) use of aspirin: Secondary | ICD-10-CM | POA: Diagnosis not present

## 2018-10-19 LAB — CBC
HCT: 31.3 % — ABNORMAL LOW (ref 36.0–46.0)
Hemoglobin: 10.3 g/dL — ABNORMAL LOW (ref 12.0–15.0)
MCH: 27 pg (ref 26.0–34.0)
MCHC: 32.9 g/dL (ref 30.0–36.0)
MCV: 81.9 fL (ref 80.0–100.0)
Platelets: 217 10*3/uL (ref 150–400)
RBC: 3.82 MIL/uL — ABNORMAL LOW (ref 3.87–5.11)
RDW: 15.2 % (ref 11.5–15.5)
WBC: 5.5 10*3/uL (ref 4.0–10.5)
nRBC: 0 % (ref 0.0–0.2)

## 2018-10-19 MED ORDER — SODIUM CHLORIDE 0.9% FLUSH: 3.0000 mL | Freq: Once | INTRAVENOUS | Status: DC

## 2018-10-19 NOTE — ED Triage Notes (Signed)
Pt from [facility] via [facility] with c/o unwitnessed fall. Staff at facility found pt on fall. No visible injuries noted. Pt not c/o pain at this time. Blood sugar 168, VSS.

## 2018-10-19 NOTE — ED Provider Notes (Signed)
[facility] Emergency Department Provider Note  ____________________________________________   First MD Initiated Contact with Patient 10/19/18 2319     (approximate)  I have reviewed the triage vital signs and the nursing notes.   HISTORY  Chief Complaint Fall    HPI Tamara Simon is a 85 y.o. female with past medical history of dementia,  diabetes, CKD, here with unwitnessed fall.  Patient was found down.  It is unclear how Tamara Simon got down.  Tamara Simon is normally not ambulatory per report.  Tamara Simon is also been more confused for the last day.  Remainder of history limited as nursing facility does not know exactly what happened, and son was not there.  Level 5 caveat invoked as remainder of history, ROS, and physical exam limited due to patient's dementia.        Past Medical History:  Diagnosis Date  . Arthritis   . Breast cancer of upper-outer quadrant of left female breast [facility]) March 2016   Triple negative  . Chronic kidney disease   . Dementia ([place])   . Diabetes mellitus without complication ([place])   . GERD (gastroesophageal reflux disease)   . Hearing loss   . Hyperlipidemia   . Hypertension   . Left ventricular hypertrophy   . Paget's bone disease   . Thyroid disease     Patient Active Problem List   Diagnosis Date Noted  . Breast cancer ([place]) 09/20/2016  . Breast cancer of upper-outer quadrant of left female breast ([place]) 05/18/2014  . Pure hypercholesterolemia 04/29/2014  . DM (diabetes mellitus) type II controlled, neurological manifestation ([place]) 09/19/2013  . Benign essential hypertension 09/19/2013  . GERD (gastroesophageal reflux disease) 09/19/2013  . Hypothyroid 09/19/2013  . LVH (left ventricular hypertrophy) due to hypertensive disease 09/19/2013  . Paget's bone disease 09/19/2013    Past Surgical History:  Procedure Laterality Date  . CATARACT EXTRACTION W/PHACO Right 08/05/2018   Procedure: CATARACT EXTRACTION PHACO AND  INTRAOCULAR LENS PLACEMENT ([place])  RIGHT DIABETIC;  Surgeon: Leandrew Koyanagi, MD;  Location: [place];  Service: Ophthalmology;  Laterality: Right;  Diabetic - oral meds HAVING COVID TEST @ 8AM SO LEAVE ARRIVAL HERE  . HERNIA REPAIR    . partial mastectomy left breast  07/08/2014  . THYROID SURGERY  85 years old    Prior to Admission medications   Medication Sig Start Date End Date Taking? Authorizing Provider  amLODipine (NORVASC) 5 MG tablet Take 5 mg by mouth daily. 04/16/14   [provider]  aspirin 81 MG tablet Take 81 mg by mouth daily.    [provider]  calcium-vitamin D (OSCAL WITH D) 500-200 MG-UNIT tablet Take 1 tablet by mouth 2 (two) times daily.    [provider]  docusate sodium (COLACE) 100 MG capsule Take 100 mg by mouth daily as needed.  07/01/14   [provider]  glimepiride (AMARYL) 1 MG tablet Take 1 mg by mouth daily with breakfast.    [provider]  iron polysaccharides (NIFEREX) 150 MG capsule Take 150 mg by mouth daily.  11/02/15 07/29/18  [provider]  levothyroxine (SYNTHROID, LEVOTHROID) 125 MCG tablet Take 125 mcg by mouth daily before breakfast.  03/09/14   [provider]  loratadine (CLARITIN) 10 MG tablet Take 10 mg by mouth daily as needed for allergies.    [provider]  losartan (COZAAR) 50 MG tablet Take 50 mg by mouth daily. 10/31/17 10/31/18  [provider]  memantine [facility])  5 MG tablet Take 5 mg by mouth 2 (two) times daily.     [provider]  polyvinyl alcohol (LIQUIFILM TEARS) 1.4 % ophthalmic solution Place 1 drop into the left eye 4 (four) times daily as needed for dry eyes.    [provider]  QUEtiapine (SEROQUEL) 25 MG tablet Take 12.5 mg by mouth at bedtime.    [provider]  sertraline (ZOLOFT) 50 MG tablet Take 50 mg by mouth daily. 03/12/18 03/12/19  [provider]  traMADol (ULTRAM) 50 MG tablet Take 50  mg by mouth daily as needed for moderate pain.  07/01/14   [provider]    Allergies Carvedilol, Clonidine, Spironolactone, and Terazosin  Family History  Problem Relation Age of Onset  . Breast cancer Sister     Social History Social History   Tobacco Use  . Smoking status: Never Smoker  . Smokeless tobacco: Never Used  Substance Use Topics  . Alcohol use: Not Currently    Alcohol/week: 0.0 standard drinks    Comment: wine at times- none since being in asst living  . Drug use: No    Review of Systems  Review of Systems  Unable to perform ROS: Dementia     ____________________________________________  PHYSICAL EXAM:      VITAL SIGNS: ED Triage Vitals [10/19/18 2306]  Enc Vitals Group     BP      Pulse      Resp      Temp 98.4 F (36.9 C)     Temp Source Oral     SpO2      Weight 109 lb 8 oz (49.7 kg)     Height 5\' 2"  (1.575 m)     Head Circumference      Peak Flow      Pain Score      Pain Loc      Pain Edu?      Excl. in [place]?      Physical Exam Vitals signs and nursing note reviewed.  Constitutional:      General: Tamara Simon is not in acute distress.    Appearance: Tamara Simon is well-developed.  HENT:     Head: Normocephalic and atraumatic.     Comments: No apparent head trauma. Mildly dry MM. Eyes:     Conjunctiva/sclera: Conjunctivae normal.  Neck:     Musculoskeletal: Neck supple.  Cardiovascular:     Rate and Rhythm: Normal rate and regular rhythm.     Heart sounds: Normal heart sounds.  Pulmonary:     Effort: Pulmonary effort is normal. No respiratory distress.     Breath sounds: No wheezing.  Abdominal:     General: There is no distension.     Comments: No overt TTP. No guarding or rebound.  Skin:    General: Skin is warm.     Capillary Refill: Capillary refill takes less than 2 seconds.     Findings: No rash.  Neurological:     Mental Status: Tamara Simon is alert. Mental status is at baseline. Tamara Simon is disoriented.     Motor: No abnormal  muscle tone.     Comments: MAE with at least antigravity strength. Disoriented at baseline.       ____________________________________________   LABS (all labs ordered are listed, but only abnormal results are displayed)  Labs Reviewed  COMPREHENSIVE METABOLIC PANEL - Abnormal; Notable for the following components:      Result Value   Glucose, Bld 149 (*)  BUN 25 (*)    Total Protein 6.0 (*)    Alkaline Phosphatase 139 (*)    GFR calc non Af Amer 51 (*)    GFR calc Af Amer 60 (*)    All other components within normal limits  CBC - Abnormal; Notable for the following components:   RBC 3.82 (*)    Hemoglobin 10.3 (*)    HCT 31.3 (*)    All other components within normal limits  URINALYSIS, COMPLETE (UACMP) WITH MICROSCOPIC - Abnormal; Notable for the following components:   Color, Urine YELLOW (*)    APPearance CLEAR (*)    Protein, ur 100 (*)    All other components within normal limits  CBG MONITORING, ED    ____________________________________________  EKG: Normal sinus rhythm, VR 59. QRS 102, QTc 437. Non specific St changes, no acute changes from prior. ________________________________________  RADIOLOGY All imaging, including plain films, CT scans, and ultrasounds, independently reviewed by me, and interpretations confirmed via formal radiology reads.  ED MD interpretation:   CT Head: neg CXR: Neg  Official radiology report(s): Ct Head Wo Contrast  Result Date: 10/20/2018 CLINICAL DATA:  Altered level of consciousness. EXAM: CT HEAD WITHOUT CONTRAST TECHNIQUE: Contiguous axial images were obtained from the base of the skull through the vertex without intravenous contrast. COMPARISON:  08/29/2017 FINDINGS: Brain: There is atrophy and chronic small vessel disease changes. No acute intracranial abnormality. Specifically, no hemorrhage, hydrocephalus, mass lesion, acute infarction, or significant intracranial injury. Vascular: No hyperdense vessel or unexpected  calcification. Skull: No acute calvarial abnormality. Sinuses/Orbits: Visualized paranasal sinuses and mastoids clear. Orbital soft tissues unremarkable. Other: None IMPRESSION: Atrophy, chronic microvascular disease. No acute intracranial abnormality. Electronically Signed   By: Rolm Baptise M.D.   On: 10/20/2018 01:07   Dg Chest Portable 1 View  Result Date: 10/19/2018 CLINICAL DATA:  Altered mental status EXAM: PORTABLE CHEST 1 VIEW COMPARISON:  07/22/2016 FINDINGS: Heart and mediastinal contours are within normal limits. No focal opacities or effusions. No acute bony abnormality. IMPRESSION: No active disease. Electronically Signed   By: Rolm Baptise M.D.   On: 10/19/2018 23:57    ____________________________________________  PROCEDURES   Procedure(s) performed (including Critical Care):  Procedures  ____________________________________________  INITIAL IMPRESSION / MDM / [place] / ED COURSE  As part of my medical decision making, I reviewed the following data within the electronic MEDICAL RECORD NUMBER Notes from prior ED visits and [place] Controlled Substance Database      *MCKENZEY STRAWDERMAN was evaluated in Emergency Department on 10/20/2018 for the symptoms described in the history of present illness. Tamara Simon was evaluated in the context of the global COVID-19 pandemic, which necessitated consideration that the patient might be at risk for infection with the SARS-CoV-2 virus that causes COVID-19. Institutional protocols and algorithms that pertain to the evaluation of patients at risk for COVID-19 are in a state of rapid change based on information released by regulatory bodies including the CDC and federal and state organizations. These policies and algorithms were followed during the patient's care in the ED.  Some ED evaluations and interventions may be delayed as a result of limited staffing during the pandemic.*   Clinical Course as of Oct 19 152  Tue Oct 20, 2018  0115 85 yo F  here with unwitnessed fall. Question of AMS though h/o severe dementia.    [CI]    Clinical Course User Index [CI] Duffy Bruce, MD    Medical Decision Making: 85 yo  F here with unwitnessed fall. No signs of trauma clinically. CT head neg. CXR clear. Labs very reassuring with no signs of AKI, UTI, or occult sepsis. EKG non-ischemic. At mental baseline per review of records. D/c back to facility.  ____________________________________________  FINAL CLINICAL IMPRESSION(S) / ED DIAGNOSES  Final diagnoses:  Fall, initial encounter     MEDICATIONS GIVEN DURING THIS VISIT:  Medications  losartan (COZAAR) tablet 50 mg (50 mg Oral Given 10/20/18 0144)     ED Discharge Orders    None       Note:  This document was prepared using Dragon voice recognition software and may include unintentional dictation errors.   Duffy Bruce, MD 10/20/18 [phone]

## 2018-10-20 ENCOUNTER — Emergency Department: Payer: Medicare Other

## 2018-10-20 LAB — COMPREHENSIVE METABOLIC PANEL
ALT: 9 U/L (ref 0–44)
AST: 16 U/L (ref 15–41)
Albumin: 3.7 g/dL (ref 3.5–5.0)
Alkaline Phosphatase: 139 U/L — ABNORMAL HIGH (ref 38–126)
Anion gap: 6 (ref 5–15)
BUN: 25 mg/dL — ABNORMAL HIGH (ref 8–23)
CO2: 28 mmol/L (ref 22–32)
Calcium: 8.9 mg/dL (ref 8.9–10.3)
Chloride: 108 mmol/L (ref 98–111)
Creatinine, Ser: 0.96 mg/dL (ref 0.44–1.00)
GFR calc Af Amer: 60 mL/min — ABNORMAL LOW (ref >60)
GFR calc non Af Amer: 51 mL/min — ABNORMAL LOW (ref >60)
Glucose, Bld: 149 mg/dL — ABNORMAL HIGH (ref 70–99)
Potassium: 3.7 mmol/L (ref 3.5–5.1)
Sodium: 142 mmol/L (ref 135–145)
Total Bilirubin: 0.4 mg/dL (ref 0.3–1.2)
Total Protein: 6 g/dL — ABNORMAL LOW (ref 6.5–8.1)

## 2018-10-20 LAB — URINALYSIS, COMPLETE (UACMP) WITH MICROSCOPIC
Bacteria, UA: NONE SEEN
Bilirubin Urine: NEGATIVE
Glucose, UA: NEGATIVE mg/dL
Hgb urine dipstick: NEGATIVE
Ketones, ur: NEGATIVE mg/dL
Leukocytes,Ua: NEGATIVE
Nitrite: NEGATIVE
Protein, ur: 100 mg/dL — AB
Specific Gravity, Urine: 1.026 (ref 1.005–1.030)
Squamous Epithelial / HPF: NONE SEEN (ref 0–5)
pH: 5 (ref 5.0–8.0)

## 2018-10-20 MED ORDER — LOSARTAN POTASSIUM 50 MG PO TABS
50.0000 mg | ORAL_TABLET | Freq: Once | ORAL | Status: AC
  Administered 2018-10-20: 02:00:00 50 mg via ORAL
  Filled 2018-10-20: qty 1

## 2018-10-20 NOTE — Discharge Instructions (Addendum)
Fortunately, Ms. Tamara Simon' CT scan and XRays were negative.  She can follow-up with her regular doctor.

## 2018-10-29 ENCOUNTER — Other Ambulatory Visit: Payer: Self-pay

## 2018-10-29 ENCOUNTER — Emergency Department: Payer: Medicare Other

## 2018-10-29 ENCOUNTER — Emergency Department
Admission: EM | Admit: 2018-10-29 | Discharge: 2018-10-29 | Disposition: A | Discharge status: Home / Self Care | Payer: Medicare Other | Attending: Emergency Medicine | Admitting: Emergency Medicine

## 2018-10-29 DIAGNOSIS — F0390 Unspecified dementia without behavioral disturbance: Secondary | ICD-10-CM | POA: Diagnosis not present

## 2018-10-29 DIAGNOSIS — N390 Urinary tract infection, site not specified: Secondary | ICD-10-CM | POA: Diagnosis not present

## 2018-10-29 DIAGNOSIS — E039 Hypothyroidism, unspecified: Secondary | ICD-10-CM | POA: Diagnosis not present

## 2018-10-29 DIAGNOSIS — S0990XA Unspecified injury of head, initial encounter: Secondary | ICD-10-CM

## 2018-10-29 DIAGNOSIS — N189 Chronic kidney disease, unspecified: Secondary | ICD-10-CM | POA: Insufficient documentation

## 2018-10-29 DIAGNOSIS — I129 Hypertensive chronic kidney disease with stage 1 through stage 4 chronic kidney disease, or unspecified chronic kidney disease: Secondary | ICD-10-CM | POA: Diagnosis not present

## 2018-10-29 DIAGNOSIS — Z7982 Long term (current) use of aspirin: Secondary | ICD-10-CM | POA: Diagnosis not present

## 2018-10-29 DIAGNOSIS — E860 Dehydration: Secondary | ICD-10-CM | POA: Diagnosis not present

## 2018-10-29 DIAGNOSIS — W050XXA Fall from non-moving wheelchair, initial encounter: Secondary | ICD-10-CM | POA: Diagnosis not present

## 2018-10-29 DIAGNOSIS — Y939 Activity, unspecified: Secondary | ICD-10-CM | POA: Diagnosis not present

## 2018-10-29 DIAGNOSIS — E1122 Type 2 diabetes mellitus with diabetic chronic kidney disease: Secondary | ICD-10-CM | POA: Diagnosis not present

## 2018-10-29 DIAGNOSIS — Z79899 Other long term (current) drug therapy: Secondary | ICD-10-CM | POA: Diagnosis not present

## 2018-10-29 DIAGNOSIS — Y999 Unspecified external cause status: Secondary | ICD-10-CM | POA: Diagnosis not present

## 2018-10-29 DIAGNOSIS — Y92129 Unspecified place in nursing home as the place of occurrence of the external cause: Secondary | ICD-10-CM | POA: Insufficient documentation

## 2018-10-29 DIAGNOSIS — S0083XA Contusion of other part of head, initial encounter: Secondary | ICD-10-CM | POA: Diagnosis not present

## 2018-10-29 LAB — URINALYSIS, COMPLETE (UACMP) WITH MICROSCOPIC
Bacteria, UA: NONE SEEN
Bilirubin Urine: NEGATIVE
Glucose, UA: NEGATIVE mg/dL
Ketones, ur: NEGATIVE mg/dL
Nitrite: NEGATIVE
Protein, ur: 100 mg/dL — AB
RBC / HPF: >50 RBC/hpf — ABNORMAL HIGH (ref 0–5)
Specific Gravity, Urine: 1.02 (ref 1.005–1.030)
pH: 6 (ref 5.0–8.0)

## 2018-10-29 LAB — BASIC METABOLIC PANEL
Anion gap: 11 (ref 5–15)
BUN: 23 mg/dL (ref 8–23)
CO2: 25 mmol/L (ref 22–32)
Calcium: 9.4 mg/dL (ref 8.9–10.3)
Chloride: 106 mmol/L (ref 98–111)
Creatinine, Ser: 1.14 mg/dL — ABNORMAL HIGH (ref 0.44–1.00)
GFR calc Af Amer: 48 mL/min — ABNORMAL LOW (ref >60)
GFR calc non Af Amer: 42 mL/min — ABNORMAL LOW (ref >60)
Glucose, Bld: 178 mg/dL — ABNORMAL HIGH (ref 70–99)
Potassium: 3.8 mmol/L (ref 3.5–5.1)
Sodium: 142 mmol/L (ref 135–145)

## 2018-10-29 LAB — CBC
HCT: 35.8 % — ABNORMAL LOW (ref 36.0–46.0)
Hemoglobin: 11.6 g/dL — ABNORMAL LOW (ref 12.0–15.0)
MCH: 27 pg (ref 26.0–34.0)
MCHC: 32.4 g/dL (ref 30.0–36.0)
MCV: 83.3 fL (ref 80.0–100.0)
Platelets: 253 10*3/uL (ref 150–400)
RBC: 4.3 MIL/uL (ref 3.87–5.11)
RDW: 15.2 % (ref 11.5–15.5)
WBC: 7 10*3/uL (ref 4.0–10.5)
nRBC: 0 % (ref 0.0–0.2)

## 2018-10-29 LAB — LACTIC ACID, PLASMA: Lactic Acid, Venous: 1.6 mmol/L (ref 0.5–1.9)

## 2018-10-29 LAB — TROPONIN I (HIGH SENSITIVITY): Troponin I (High Sensitivity): 16 ng/L (ref <18)

## 2018-10-29 MED ORDER — SODIUM CHLORIDE 0.9 % IV BOLUS
1000.0000 mL | Freq: Once | INTRAVENOUS | Status: AC
  Administered 2018-10-29: 1000 mL via INTRAVENOUS

## 2018-10-29 MED ORDER — LORAZEPAM 2 MG/ML IJ SOLN
INTRAMUSCULAR | Status: AC
  Filled 2018-10-29: qty 1

## 2018-10-29 MED ORDER — CEPHALEXIN 500 MG PO CAPS: 500.0000 mg | ORAL_CAPSULE | Freq: Three times a day (TID) | ORAL | 0 refills | Status: AC

## 2018-10-29 MED ORDER — LORAZEPAM 2 MG/ML IJ SOLN
0.5000 mg | Freq: Once | INTRAMUSCULAR | Status: AC
  Administered 2018-10-29: 18:00:00 0.5 mg via INTRAVENOUS

## 2018-10-29 MED ORDER — METOPROLOL TARTRATE 5 MG/5ML IV SOLN: 2.5000 mg | Freq: Once | INTRAVENOUS | Status: DC

## 2018-10-29 MED ORDER — SODIUM CHLORIDE 0.9 % IV SOLN
2.0000 g | Freq: Once | INTRAVENOUS | Status: AC
  Administered 2018-10-29: 18:00:00 2 g via INTRAVENOUS
  Filled 2018-10-29: qty 20

## 2018-10-29 NOTE — ED Notes (Signed)
Dr. Ellender Hose at bedside, pt responds appropriately to questions at this time, no c/o pain.

## 2018-10-29 NOTE — ED Notes (Signed)
X-ray at bedside

## 2018-10-29 NOTE — ED Notes (Signed)
Pt unable to sign for transport back to [facility] due to dementia

## 2018-10-29 NOTE — ED Provider Notes (Signed)
[facility] Emergency Department Provider Note  ____________________________________________   First MD Initiated Contact with Patient 10/29/18 1259     (approximate)  I have reviewed the triage vital signs and the nursing notes.   HISTORY  Chief Complaint Fall and Head Injury    HPI AAIMA HASE is a 85 y.o. female with CKD, dementia, DM, HTN, HLD, h/o breast CA here with fall. Pt has h/o severe dementia limiting history. Per report, pt fell forward out of her wheelchair and struck her head. She did not lose consciousness. Minimally verbal at baseline. On my interview, pt will occasionally respond incoheretly to questions. She is alert and looking at speaker but not interactive.  Level 5 caveat invoked as remainder of history, ROS, and physical exam limited due to patient's dementia.         Past Medical History:  Diagnosis Date  . Arthritis   . Breast cancer of upper-outer quadrant of left female breast [facility]) March 2016   Triple negative  . Chronic kidney disease   . Dementia ([place])   . Diabetes mellitus without complication ([place])   . GERD (gastroesophageal reflux disease)   . Hearing loss   . Hyperlipidemia   . Hypertension   . Left ventricular hypertrophy   . Paget's bone disease   . Thyroid disease     Patient Active Problem List   Diagnosis Date Noted  . Breast cancer ([place]) 09/20/2016  . Breast cancer of upper-outer quadrant of left female breast ([place]) 05/18/2014  . Pure hypercholesterolemia 04/29/2014  . DM (diabetes mellitus) type II controlled, neurological manifestation ([place]) 09/19/2013  . Benign essential hypertension 09/19/2013  . GERD (gastroesophageal reflux disease) 09/19/2013  . Hypothyroid 09/19/2013  . LVH (left ventricular hypertrophy) due to hypertensive disease 09/19/2013  . Paget's bone disease 09/19/2013    Past Surgical History:  Procedure Laterality Date  . CATARACT EXTRACTION W/PHACO Right 08/05/2018    Procedure: CATARACT EXTRACTION PHACO AND INTRAOCULAR LENS PLACEMENT ([place])  RIGHT DIABETIC;  Surgeon: Leandrew Koyanagi, MD;  Location: [facility];  Service: Ophthalmology;  Laterality: Right;  Diabetic - oral meds HAVING COVID TEST @ 8AM SO LEAVE ARRIVAL HERE  . HERNIA REPAIR    . partial mastectomy left breast  07/08/2014  . THYROID SURGERY  85 years old    Prior to Admission medications   Medication Sig Start Date End Date Taking? Authorizing Provider  amLODipine (NORVASC) 10 MG tablet Take 10 mg by mouth daily.   Yes [provider]  aspirin 81 MG chewable tablet Chew 81 mg by mouth daily.   Yes [provider]  Difluprednate 0.05 % EMUL Place 1 drop into both eyes 4 (four) times daily.   Yes [provider]  docusate sodium (COLACE) 100 MG capsule Take 100 mg by mouth 2 (two) times daily.    Yes [provider]  glimepiride (AMARYL) 1 MG tablet Take 1 mg by mouth every morning.    Yes [provider]  iron polysaccharides (NIFEREX) 150 MG capsule Take 150 mg by mouth daily.   Yes [provider]  ketorolac (ACULAR) 0.5 % ophthalmic solution Place 1 drop into both eyes 4 (four) times daily.   Yes [provider]  levothyroxine (SYNTHROID, LEVOTHROID) 125 MCG tablet Take 62.5 mcg by mouth daily before breakfast. Except for Saturday and Sunday   Yes [provider]  loratadine (CLARITIN) 10 MG tablet Take 10 mg by mouth daily as needed for allergies.  Yes [provider]  losartan (COZAAR) 50 MG tablet Take 50 mg by mouth daily. 10/31/17 10/31/18 Yes [provider]  memantine (NAMENDA) 5 MG tablet Take 5 mg by mouth 2 (two) times daily.    Yes [provider]  polyvinyl alcohol (LIQUIFILM TEARS) 1.4 % ophthalmic solution Place 1 drop into the left eye 4 (four) times daily as needed for dry eyes.   Yes [provider]  QUEtiapine (SEROQUEL) 25 MG tablet Take 25 mg by mouth at  bedtime.    Yes [provider]  sertraline (ZOLOFT) 50 MG tablet Take 50 mg by mouth daily. 03/12/18 03/12/19 Yes [provider]  cephALEXin (KEFLEX) 500 MG capsule Take 1 capsule (500 mg total) by mouth 3 (three) times daily for 7 days. 10/29/18 11/05/18  Duffy Bruce, MD    Allergies Carvedilol, Clonidine, Spironolactone, and Terazosin  Family History  Problem Relation Age of Onset  . Breast cancer Sister     Social History Social History   Tobacco Use  . Smoking status: Never Smoker  . Smokeless tobacco: Never Used  Substance Use Topics  . Alcohol use: Not Currently    Alcohol/week: 0.0 standard drinks    Comment: wine at times- none since being in asst living  . Drug use: No    Review of Systems  Review of Systems  Unable to perform ROS: Dementia     ____________________________________________  PHYSICAL EXAM:      VITAL SIGNS: ED Triage Vitals  Enc Vitals Group     BP 10/29/18 1243 (!) 99/45     Pulse Rate 10/29/18 1243 62     Resp 10/29/18 1243 14     Temp 10/29/18 1243 98.5 F (36.9 C)     Temp Source 10/29/18 1243 Oral     SpO2 10/29/18 1243 100 %     Weight 10/29/18 1235 110 lb (49.9 kg)     Height 10/29/18 1235 5\' 2"  (1.575 m)     Head Circumference --      Peak Flow --      Pain Score 10/29/18 1323 0     Pain Loc --      Pain Edu? --      Excl. in [place]? --      Physical Exam Vitals signs and nursing note reviewed.  Constitutional:      General: She is not in acute distress.    Appearance: She is well-developed.  HENT:     Head: Normocephalic and atraumatic.     Comments: Small, superficial hematoma to anterior forehead. No overlying abrasions or laceration. No TTP. No periorbital or postauricular ecchymoses. Eyes:     Conjunctiva/sclera: Conjunctivae normal.  Neck:     Musculoskeletal: Neck supple.  Cardiovascular:     Rate and Rhythm: Normal rate and regular rhythm.     Heart sounds: Normal heart sounds.  Pulmonary:      Effort: Pulmonary effort is normal. No respiratory distress.     Breath sounds: No wheezing.  Abdominal:     General: Abdomen is flat. There is no distension.     Tenderness: There is no abdominal tenderness.  Skin:    General: Skin is warm.     Capillary Refill: Capillary refill takes less than 2 seconds.     Findings: No rash.  Neurological:     Mental Status: She is alert and oriented to person, place, and time.     Motor: No abnormal muscle tone.  ____________________________________________   LABS (all labs ordered are listed, but only abnormal results are displayed)  Labs Reviewed  BASIC METABOLIC PANEL - Abnormal; Notable for the following components:      Result Value   Glucose, Bld 178 (*)    Creatinine, Ser 1.14 (*)    GFR calc non Af Amer 42 (*)    GFR calc Af Amer 48 (*)    All other components within normal limits  CBC - Abnormal; Notable for the following components:   Hemoglobin 11.6 (*)    HCT 35.8 (*)    All other components within normal limits  URINALYSIS, COMPLETE (UACMP) WITH MICROSCOPIC - Abnormal; Notable for the following components:   Color, Urine YELLOW (*)    APPearance CLEAR (*)    Hgb urine dipstick MODERATE (*)    Protein, ur 100 (*)    Leukocytes,Ua TRACE (*)    RBC / HPF >50 (*)    All other components within normal limits  CULTURE, BLOOD (ROUTINE X 2)  CULTURE, BLOOD (ROUTINE X 2)  LACTIC ACID, PLASMA  LACTIC ACID, PLASMA  TROPONIN I (HIGH SENSITIVITY)  TROPONIN I (HIGH SENSITIVITY)    ____________________________________________  EKG: normal sinus rhythm, VR 67. PR 126, QRS 82, QTc 443. TWA laterally. No ST elevations or depressions. ________________________________________  RADIOLOGY All imaging, including plain films, CT scans, and ultrasounds, independently reviewed by me, and interpretations confirmed via formal radiology reads.  ED MD interpretation:   CXR: Clear CT Head/C-Spine: NAICA, chronic  changes  Official radiology report(s): Dg Chest 1 View  Result Date: 10/29/2018 CLINICAL DATA:  fall from her chair, states she has a hx of dementia and falls. Pt has a hematoma to the center of Forehead and arrived with urinary incontinence. Hx of hypertension, diabetes, left breast cancer, partial left breast mastectomy. Non smoker EXAM: CHEST  1 VIEW COMPARISON:  10/19/2018 FINDINGS: Cardiac silhouette is normal in size. No mediastinal or hilar masses. No evidence of adenopathy. Clear lungs.  No pleural effusion or pneumothorax. Skeletal structures are grossly intact. There are stable changes from left breast surgery. IMPRESSION: No active disease. Electronically Signed   By: Lajean Manes M.D.   On: 10/29/2018 13:57   Ct Head Wo Contrast  Result Date: 10/29/2018 CLINICAL DATA:  Fall from chair with forehead hematoma. EXAM: CT HEAD WITHOUT CONTRAST CT CERVICAL SPINE WITHOUT CONTRAST TECHNIQUE: Multidetector CT imaging of the head and cervical spine was performed following the standard protocol without intravenous contrast. Multiplanar CT image reconstructions of the cervical spine were also generated. COMPARISON:  Head CT 10/20/2018 FINDINGS: CT HEAD FINDINGS Brain: There is no evidence of acute infarct, intracranial hemorrhage, mass, midline shift, or extra-axial fluid collection. Mild enlargement of the lateral and third ventricles is unchanged and favored to reflect central predominant cerebral atrophy. Patchy hypodensities in the cerebral white matter are also unchanged and nonspecific but compatible with moderate chronic small vessel ischemic disease. Vascular: Calcified atherosclerosis at the skull base. No hyperdense vessel. Skull: No fracture or destructive osseous lesion. Sinuses/Orbits: Subcentimeter left maxillary sinus mucous retention cyst. Clear mastoid air cells. Right cataract extraction. Other: Small left parietal scalp hematoma. CT CERVICAL SPINE FINDINGS Alignment: Slight  retrolisthesis of C3 on C4, C4 on C5, and C5 on C6 and slight anterolisthesis of C7 on T1. Slight left convex curvature of the cervical spine. Skull base and vertebrae: No acute fracture or suspicious osseous lesion. Soft tissues and spinal canal: No prevertebral fluid or swelling. No visible canal hematoma. Disc  levels: Advanced disc degeneration from C3-4 to C5-6 with disc space narrowing, degenerative endplate sclerosis, and spurring. Severe right-sided neural foraminal stenosis at C4-5 and C5-6 due to uncovertebral spurring. Mild spinal stenosis at C4-5. Upper chest: Clear lung apices. Other: Partially visualized right thyroid lobe enlargement with evidence of multiple underlying nodules resulting in slight deviation of the trachea. 1.5 x 1.3 x 2.0 cm midline anterior neck mass which extends between the hyoid bone and thyroid cartilage and is of heterogeneous attenuation with punctate internal calcification, similar in attenuation to the right thyroid lobe. IMPRESSION: 1. No evidence of acute intracranial abnormality. 2. Small left parietal scalp hematoma. 3. Unchanged chronic small vessel ischemic disease and cerebral atrophy. 4. No cervical spine fracture. 5. Advanced cervical disc degeneration. 6. 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 Electronically Signed   By: Logan Bores M.D.   On: 10/29/2018 14:30   Ct Cervical Spine Wo Contrast  Result Date: 10/29/2018 CLINICAL DATA:  Fall from chair with forehead hematoma. EXAM: CT HEAD WITHOUT CONTRAST CT CERVICAL SPINE WITHOUT CONTRAST TECHNIQUE: Multidetector CT imaging of the head and cervical spine was performed following the standard protocol without intravenous contrast. Multiplanar CT image reconstructions of the cervical spine were also generated. COMPARISON:  Head CT 10/20/2018 FINDINGS: CT HEAD FINDINGS Brain: There is no evidence of acute infarct, intracranial  hemorrhage, mass, midline shift, or extra-axial fluid collection. Mild enlargement of the lateral and third ventricles is unchanged and favored to reflect central predominant cerebral atrophy. Patchy hypodensities in the cerebral white matter are also unchanged and nonspecific but compatible with moderate chronic small vessel ischemic disease. Vascular: Calcified atherosclerosis at the skull base. No hyperdense vessel. Skull: No fracture or destructive osseous lesion. Sinuses/Orbits: Subcentimeter left maxillary sinus mucous retention cyst. Clear mastoid air cells. Right cataract extraction. Other: Small left parietal scalp hematoma. CT CERVICAL SPINE FINDINGS Alignment: Slight retrolisthesis of C3 on C4, C4 on C5, and C5 on C6 and slight anterolisthesis of C7 on T1. Slight left convex curvature of the cervical spine. Skull base and vertebrae: No acute fracture or suspicious osseous lesion. Soft tissues and spinal canal: No prevertebral fluid or swelling. No visible canal hematoma. Disc levels: Advanced disc degeneration from C3-4 to C5-6 with disc space narrowing, degenerative endplate sclerosis, and spurring. Severe right-sided neural foraminal stenosis at C4-5 and C5-6 due to uncovertebral spurring. Mild spinal stenosis at C4-5. Upper chest: Clear lung apices. Other: Partially visualized right thyroid lobe enlargement with evidence of multiple underlying nodules resulting in slight deviation of the trachea. 1.5 x 1.3 x 2.0 cm midline anterior neck mass which extends between the hyoid bone and thyroid cartilage and is of heterogeneous attenuation with punctate internal calcification, similar in attenuation to the right thyroid lobe. IMPRESSION: 1. No evidence of acute intracranial abnormality. 2. Small left parietal scalp hematoma. 3. Unchanged chronic small vessel ischemic disease and cerebral atrophy. 4. No cervical spine fracture. 5. Advanced cervical disc degeneration. 6. 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 Electronically Signed   By: Logan Bores M.D.   On: 10/29/2018 14:30    ____________________________________________  PROCEDURES   Procedure(s) performed (including Critical Care):  Procedures  ____________________________________________  INITIAL IMPRESSION / MDM / [place] / ED COURSE  As part of my medical decision making, I reviewed the following data within the [place]  Notes from prior ED visits and [place] Controlled Substance Database      *KIERNEY ORECCHIO was evaluated in Emergency Department on 10/29/2018 for the symptoms described in the history of present illness. She was evaluated in the context of the global COVID-19 pandemic, which necessitated consideration that the patient might be at risk for infection with the SARS-CoV-2 virus that causes COVID-19. Institutional protocols and algorithms that pertain to the evaluation of patients at risk for COVID-19 are in a state of rapid change based on information released by regulatory bodies including the CDC and federal and state organizations. These policies and algorithms were followed during the patient's care in the ED.  Some ED evaluations and interventions may be delayed as a result of limited staffing during the pandemic.*   Clinical Course as of Oct 28 1740  Thu Oct 29, 2018  1331 85 yo F here with fall. H/o same. History limited 2/2 severe dementia. Pt has been seen here previously for this in the past. Superficial hematoma noted on exam w/ no lacs, no deformity. Will check CT. Screening labs sent in triage.   [CI]  1417 Chest x-ray negative.  Lab work shows baseline mild anemia.  Creatinine at baseline.  Trope negative, lactic acid normal, blood pressure at baseline.  Will follow-up urinalysis, suspect she may actually be able to be discharged for recurrent mechanical fall if remainder of imaging and labs  are negative.   [CI]  1455 Labs very reassuring. LA normal, trop neg. Fluids given for poor PO intake, UA pending. CT neg.   [CI]  1742 UA is consistent with UTI.  Given that she is unable to tell me symptoms, she was given a dose of Rocephin and will discharge on Keflex.  She otherwise appears very hemodynamically stable.  Mild agitation improved with Ativan 0.5, she tolerated well.  Discharge back to facility.  Family updated by nursing.   [CI]    Clinical Course User Index [CI] Duffy Bruce, MD    Medical Decision Making: As above.  ____________________________________________  FINAL CLINICAL IMPRESSION(S) / ED DIAGNOSES  Final diagnoses:  Injury of head, initial encounter  Mild dehydration  Lower urinary tract infection     MEDICATIONS GIVEN DURING THIS VISIT:  Medications  cefTRIAXone (ROCEPHIN) 2 g in sodium chloride 0.9 % 100 mL IVPB (has no administration in time range)  LORazepam (ATIVAN) 2 MG/ML injection (has no administration in time range)  sodium chloride 0.9 % bolus 1,000 mL (0 mLs Intravenous Stopped 10/29/18 1624)  LORazepam (ATIVAN) injection 0.5 mg (0.5 mg Intravenous Given 10/29/18 1738)     ED Discharge Orders         Ordered    cephALEXin (KEFLEX) 500 MG capsule  3 times daily     10/29/18 1738           Note:  This document was prepared using Dragon voice recognition software and may include unintentional dictation errors.   Duffy Bruce, MD 10/29/18 1742

## 2018-10-29 NOTE — ED Triage Notes (Addendum)
Pt comes into the ED via EMS from [facility] with a fall from her chair, states she has a hx of dementia and falls. Pt has a hematoma to the center of Forehead and arrived with urinary incontinence. Pt has a recent hx of UTI. Pt is alert on arrival.

## 2018-10-29 NOTE — ED Notes (Signed)
Pt attempting to get out of bed, redirected patient and assisted her back to bed, pt confused at this time, fall mats placed and bed alarm turned back on.

## 2018-10-29 NOTE — ED Notes (Signed)
Secretary called for EMS transport to [facility]

## 2018-10-29 NOTE — ED Notes (Addendum)
Attempted to get urine with in and out cath with [facility] RN but did not retrieve urine, Dr. Ellender Hose informed, see new orders

## 2018-10-29 NOTE — ED Notes (Signed)
[facility] called for transport to [facility]  

## 2018-10-29 NOTE — ED Notes (Signed)
Pt now otf for imaging

## 2018-10-29 NOTE — ED Notes (Signed)
Attempted to call report to [facility], line rang continuously with no answer. Son informed pt is going back to nursing facility

## 2018-10-29 NOTE — ED Notes (Signed)
First set of blood cultures from left wrist area where I tried to start iv.  Had to remove as it would not thread in.

## 2018-10-29 NOTE — Discharge Instructions (Addendum)
Fortunately, your scan and labs look very reassuring today.   You did have inflammation in your urine. Tamara Simon was given a dose of Rocephin and should continue outpatient antibiotics.  For the facility, please encourage [name] to eat and drink as much as possible.  Her labs showed mild dehydration today.

## 2018-11-03 LAB — CULTURE, BLOOD (ROUTINE X 2)
Culture: NO GROWTH
Culture: NO GROWTH
Special Requests: ADEQUATE
Special Requests: ADEQUATE

## 2018-12-05 ENCOUNTER — Emergency Department
Admission: EM | Admit: 2018-12-05 | Discharge: 2018-12-05 | Disposition: A | Discharge status: Home / Self Care | Payer: Medicare Other | Attending: Emergency Medicine | Admitting: Emergency Medicine

## 2018-12-05 ENCOUNTER — Other Ambulatory Visit: Payer: Self-pay

## 2018-12-05 ENCOUNTER — Emergency Department: Payer: Medicare Other

## 2018-12-05 DIAGNOSIS — S0003XA Contusion of scalp, initial encounter: Secondary | ICD-10-CM

## 2018-12-05 DIAGNOSIS — Z79899 Other long term (current) drug therapy: Secondary | ICD-10-CM | POA: Diagnosis not present

## 2018-12-05 DIAGNOSIS — E1122 Type 2 diabetes mellitus with diabetic chronic kidney disease: Secondary | ICD-10-CM | POA: Diagnosis not present

## 2018-12-05 DIAGNOSIS — S0990XA Unspecified injury of head, initial encounter: Secondary | ICD-10-CM | POA: Diagnosis present

## 2018-12-05 DIAGNOSIS — Y939 Activity, unspecified: Secondary | ICD-10-CM | POA: Insufficient documentation

## 2018-12-05 DIAGNOSIS — Y999 Unspecified external cause status: Secondary | ICD-10-CM | POA: Diagnosis not present

## 2018-12-05 DIAGNOSIS — N189 Chronic kidney disease, unspecified: Secondary | ICD-10-CM | POA: Diagnosis not present

## 2018-12-05 DIAGNOSIS — W19XXXA Unspecified fall, initial encounter: Secondary | ICD-10-CM | POA: Diagnosis not present

## 2018-12-05 DIAGNOSIS — E039 Hypothyroidism, unspecified: Secondary | ICD-10-CM | POA: Diagnosis not present

## 2018-12-05 DIAGNOSIS — Y92122 Bedroom in nursing home as the place of occurrence of the external cause: Secondary | ICD-10-CM | POA: Insufficient documentation

## 2018-12-05 DIAGNOSIS — I129 Hypertensive chronic kidney disease with stage 1 through stage 4 chronic kidney disease, or unspecified chronic kidney disease: Secondary | ICD-10-CM | POA: Insufficient documentation

## 2018-12-05 DIAGNOSIS — F0390 Unspecified dementia without behavioral disturbance: Secondary | ICD-10-CM | POA: Insufficient documentation

## 2018-12-05 DIAGNOSIS — Z7982 Long term (current) use of aspirin: Secondary | ICD-10-CM | POA: Diagnosis not present

## 2018-12-05 LAB — GLUCOSE, CAPILLARY
Glucose-Capillary: 62 mg/dL — ABNORMAL LOW (ref 70–99)
Glucose-Capillary: 67 mg/dL — ABNORMAL LOW (ref 70–99)

## 2018-12-05 MED ORDER — LOSARTAN POTASSIUM 50 MG PO TABS
50.0000 mg | ORAL_TABLET | ORAL | Status: AC
  Administered 2018-12-05: 50 mg via ORAL
  Filled 2018-12-05: qty 1

## 2018-12-05 MED ORDER — AMLODIPINE BESYLATE 5 MG PO TABS
10.0000 mg | ORAL_TABLET | Freq: Once | ORAL | Status: AC
  Administered 2018-12-05: 09:00:00 10 mg via ORAL
  Filled 2018-12-05: qty 2

## 2018-12-05 NOTE — ED Notes (Signed)
Patient transported to CT 

## 2018-12-05 NOTE — ED Notes (Signed)
Pt taken to CT.

## 2018-12-05 NOTE — ED Provider Notes (Signed)
[facility] Emergency Department Provider Note ____________________________________________   First MD Initiated Contact with Patient 12/05/18 0802     (approximate)  I have reviewed the triage vital signs and the nursing notes.  HISTORY  Chief Complaint Fall EM caveat: Dementia  HPI Tamara Simon is a 85 y.o. female here for evaluation after fall   Discussed with the patient's son who is medical decision maker/agent, he reports that he was informed that his mother was found next the bed this morning.  I believe that she fell out of bed.  He has not had any report of her being recently ill.  She does have severe dementia, he is not surprised at all that she would think that she is here at the hospital to have a baby and reports her dementia has become quite severe.  She does fall frequently, and the facility has been working to try to minimize these risk as best possible  Patient herself denies being any pain.  Patient however does note that she must be here at the hospital because she is going to be or is having a baby soon.  Denies any specific pain.  Denies headache, but does reach up touches the side of her scalp where she has a hematoma occasionally  Past Medical History:  Diagnosis Date  . Arthritis   . Breast cancer of upper-outer quadrant of left female breast [facility]) March 2016   Triple negative  . Chronic kidney disease   . Dementia ([place])   . Diabetes mellitus without complication ([place])   . GERD (gastroesophageal reflux disease)   . Hearing loss   . Hyperlipidemia   . Hypertension   . Left ventricular hypertrophy   . Paget's bone disease   . Thyroid disease     Patient Active Problem List   Diagnosis Date Noted  . Breast cancer ([place]) 09/20/2016  . Breast cancer of upper-outer quadrant of left female breast ([place]) 05/18/2014  . Pure hypercholesterolemia 04/29/2014  . DM (diabetes mellitus) type II controlled, neurological manifestation  ([place]) 09/19/2013  . Benign essential hypertension 09/19/2013  . GERD (gastroesophageal reflux disease) 09/19/2013  . Hypothyroid 09/19/2013  . LVH (left ventricular hypertrophy) due to hypertensive disease 09/19/2013  . Paget's bone disease 09/19/2013    Past Surgical History:  Procedure Laterality Date  . CATARACT EXTRACTION W/PHACO Right 08/05/2018   Procedure: CATARACT EXTRACTION PHACO AND INTRAOCULAR LENS PLACEMENT ([place])  RIGHT DIABETIC;  Surgeon: Leandrew Koyanagi, MD;  Location: [place];  Service: Ophthalmology;  Laterality: Right;  Diabetic - oral meds HAVING COVID TEST @ 8AM SO LEAVE ARRIVAL HERE  . HERNIA REPAIR    . partial mastectomy left breast  07/08/2014  . THYROID SURGERY  85 years old    Prior to Admission medications   Medication Sig Start Date End Date Taking? Authorizing Provider  amLODipine (NORVASC) 10 MG tablet Take 10 mg by mouth daily.   Yes [provider]  aspirin 81 MG chewable tablet Chew 81 mg by mouth daily.   Yes [provider]  Difluprednate 0.05 % EMUL Place 1 drop into both eyes 4 (four) times daily.   Yes [provider]  docusate sodium (COLACE) 100 MG capsule Take 100 mg by mouth 2 (two) times daily.    Yes [provider]  glimepiride (AMARYL) 1 MG tablet Take 1 mg by mouth every morning.    Yes [provider]  iron polysaccharides (NIFEREX) 150 MG capsule Take 150 mg  by mouth daily.   Yes [provider]  ketorolac (ACULAR) 0.5 % ophthalmic solution Place 1 drop into both eyes 4 (four) times daily.   Yes [provider]  levothyroxine (SYNTHROID, LEVOTHROID) 125 MCG tablet Take 62.5 mcg by mouth daily before breakfast. Except for Saturday and Sunday   Yes [provider]  loratadine (CLARITIN) 10 MG tablet Take 10 mg by mouth daily as needed for allergies.   Yes [provider]  memantine (NAMENDA) 5 MG tablet Take 5 mg by mouth 2 (two) times daily.     Yes [provider]  QUEtiapine (SEROQUEL) 25 MG tablet Take 25 mg by mouth at bedtime.    Yes [provider]  sertraline (ZOLOFT) 50 MG tablet Take 50 mg by mouth daily. 03/12/18 03/12/19 Yes [provider]  polyvinyl alcohol (LIQUIFILM TEARS) 1.4 % ophthalmic solution Place 1 drop into the left eye 4 (four) times daily as needed for dry eyes.    [provider]    Allergies Carvedilol, Clonidine, Spironolactone, and Terazosin  Family History  Problem Relation Age of Onset  . Breast cancer Sister     Social History Social History   Tobacco Use  . Smoking status: Never Smoker  . Smokeless tobacco: Never Used  Substance Use Topics  . Alcohol use: Not Currently    Alcohol/week: 0.0 standard drinks    Comment: wine at times- none since being in asst living  . Drug use: No    Review of Systems  EM caveat   ____________________________________________   PHYSICAL EXAM:  VITAL SIGNS: ED Triage Vitals  Enc Vitals Group     BP 12/05/18 0806 (!) 222/132     Pulse Rate 12/05/18 0806 63     Resp --      Temp 12/05/18 0806 97.9 F (36.6 C)     Temp Source 12/05/18 0806 Oral     SpO2 12/05/18 0806 100 %     Weight 12/05/18 0801 125 lb 14.4 oz (57.1 kg)     Height 12/05/18 0801 5\' 4"  (1.626 m)     Head Circumference --      Peak Flow --      Pain Score 12/05/18 0800 0     Pain Loc --      Pain Edu? --      Excl. in [place]? --   Blood pressure quite elevated, medication list from her facility demonstrates that her antihypertensive medications have not yet been administered today  Constitutional: Alert and oriented to self name, but not to place and believes she is here likely to have a baby. Well appearing and in no acute distress.  She is pleasant. Eyes: Conjunctivae are normal. Head: Atraumatic except for a moderate sized hematoma over the right lateral frontal scalp without bleeding. Nose: No congestion/rhinnorhea. Mouth/Throat: Mucous  membranes are moist. Neck: No stridor.  There is full range of motion the neck and she does not report neck pain.  No tenderness to palpation of the cervical thoracic or lumbar spine. Cardiovascular: Normal rate, regular rhythm. Grossly normal heart sounds.  Good peripheral circulation. Respiratory: Normal respiratory effort.  No retractions. Lungs CTAB. Gastrointestinal: Soft and nontender. No distention. Musculoskeletal: No lower extremity tenderness nor edema.  She moves all of her extremities well through full range of motion without pain or discomfort. Neurologic:  Normal speech and language. No gross focal neurologic deficits are appreciated.  She is extremely hard of hearing. Skin:  Skin is warm, dry  and intact. No rash noted. Psychiatric: Mood and affect are a little bit anxious, she fears that she is here to have a baby. Speech and behavior are normal.  ____________________________________________   LABS (all labs ordered are listed, but only abnormal results are displayed)  Labs Reviewed  GLUCOSE, CAPILLARY - Abnormal; Notable for the following components:      Result Value   Glucose-Capillary 62 (*)    All other components within normal limits  GLUCOSE, CAPILLARY - Abnormal; Notable for the following components:   Glucose-Capillary 67 (*)    All other components within normal limits  CBG MONITORING, ED  CBG MONITORING, ED   ____________________________________________  EKG  Reviewed entered by me at 810 Heart rate 65 QRS 120 QTc 440 Normal sinus rhythm, mild T wave inversions 1 and aVL, compared with previous EKGs there is no significant changes.  No evidence of acute ischemia ____________________________________________  RADIOLOGY  Ct Head Wo Contrast  Result Date: 12/05/2018 CLINICAL DATA:  Head trauma. Hematoma to right temporal region. EXAM: CT HEAD WITHOUT CONTRAST CT CERVICAL SPINE WITHOUT CONTRAST TECHNIQUE: Multidetector CT imaging of the head and cervical  spine was performed following the standard protocol without intravenous contrast. Multiplanar CT image reconstructions of the cervical spine were also generated. COMPARISON:  10/29/2018 FINDINGS: CT HEAD FINDINGS Brain: No evidence of acute infarction, hemorrhage, hydrocephalus, extra-axial collection or mass lesion/mass effect. There is mild diffuse low-attenuation within the subcortical and periventricular white matter compatible with chronic microvascular disease. Vascular: No hyperdense vessel or unexpected calcification. Skull: Normal. Negative for fracture or focal lesion. Sinuses/Orbits: No acute finding. Other: Right frontoparietal scalp hematoma, image 15/2. CT CERVICAL SPINE FINDINGS Alignment: Normal. Skull base and vertebrae: No acute fracture. No primary bone lesion or focal pathologic process. Soft tissues and spinal canal: No prevertebral fluid or swelling. No visible canal hematoma. Disc levels: Marked multi level disc space narrowing and exuberant ventral spur formation is noted from C3-4 through C6-7. Upper chest: Multinodular thyroid gland is identified with mass effect upon the trachea. Other: None IMPRESSION: 1. No acute intracranial abnormalities. 2. Chronic small vessel ischemic change and brain atrophy. 3. Right frontoparietal scalp hematoma. 4. No evidence for cervical spine fracture. 5. Advanced cervical spondylosis. 6. Multinodular thyroid gland. Electronically Signed   By: Kerby Moors M.D.   On: 12/05/2018 09:02   Ct Cervical Spine Wo Contrast  Result Date: 12/05/2018 CLINICAL DATA:  Head trauma. Hematoma to right temporal region. EXAM: CT HEAD WITHOUT CONTRAST CT CERVICAL SPINE WITHOUT CONTRAST TECHNIQUE: Multidetector CT imaging of the head and cervical spine was performed following the standard protocol without intravenous contrast. Multiplanar CT image reconstructions of the cervical spine were also generated. COMPARISON:  10/29/2018 FINDINGS: CT HEAD FINDINGS Brain: No  evidence of acute infarction, hemorrhage, hydrocephalus, extra-axial collection or mass lesion/mass effect. There is mild diffuse low-attenuation within the subcortical and periventricular white matter compatible with chronic microvascular disease. Vascular: No hyperdense vessel or unexpected calcification. Skull: Normal. Negative for fracture or focal lesion. Sinuses/Orbits: No acute finding. Other: Right frontoparietal scalp hematoma, image 15/2. CT CERVICAL SPINE FINDINGS Alignment: Normal. Skull base and vertebrae: No acute fracture. No primary bone lesion or focal pathologic process. Soft tissues and spinal canal: No prevertebral fluid or swelling. No visible canal hematoma. Disc levels: Marked multi level disc space narrowing and exuberant ventral spur formation is noted from C3-4 through C6-7. Upper chest: Multinodular thyroid gland is identified with mass effect upon the trachea. Other: None IMPRESSION: 1. No acute intracranial  abnormalities. 2. Chronic small vessel ischemic change and brain atrophy. 3. Right frontoparietal scalp hematoma. 4. No evidence for cervical spine fracture. 5. Advanced cervical spondylosis. 6. Multinodular thyroid gland. Electronically Signed   By: Kerby Moors M.D.   On: 12/05/2018 09:02     Imaging results reviewed by me, scalp hematoma.  No cervical spine fracture. ____________________________________________   PROCEDURES  Procedure(s) performed: None  Procedures  Critical Care performed: No  ____________________________________________   INITIAL IMPRESSION / ASSESSMENT AND PLAN / ED COURSE  Pertinent labs & imaging results that were available during my care of the patient were reviewed by me and considered in my medical decision making (see chart for details).   Fall.  Likely mechanical in nature.  Discussed with the patient's son, discussed goals of care, she has not been recently ill, and advanced lab testing was discussed including obtaining labs such  as CBC chemistry, urine, patient's son would like to minimize none testing unless needed.  I suspect mechanical nature leading to fall.  Discussed with the patient son is agreeable with obtaining imaging to evaluate for significant injury to the head, if this is negative viewed like for her to be discharged back to her care facility which I think is quite reasonable.  We will give her home blood pressure medications, monitor her blood pressure.  Placed under fall precautions.  No noted Covid risk factor other than living in a skilled nursing facility but no symptoms.  No evidence of trauma to the extremities.  No evident cervical injury, but with her severe dementia I will obtain imaging of the C-spine.  Full forego placing patient in cervical collar as I suspect this was likely lead to worsening of disorientation and anxiety with a low pretest probability.     ----------------------------------------- 9:21 AM on 12/05/2018 -----------------------------------------  Resting comfortably.  No distress.  CT reassuring for no evidence of major traumatic injury.  ----------------------------------------- 9:40 AM on 12/05/2018 -----------------------------------------  Patient eating and drinking.  Previously discussed plan to discharge back to her care home with her son, will proceed with plan as work-up here reassuring.  Blood pressure is improved somewhat after taking her blood pressure medications, she does not have evidence of acute hypertensive emergency ____________________________________________   FINAL CLINICAL IMPRESSION(S) / ED DIAGNOSES  Final diagnoses:  Fall, initial encounter  Hematoma of scalp, initial encounter        Note:  This document was prepared using Systems analyst and may include unintentional dictation errors       Delman Kitten, MD 12/05/18 [phone]

## 2018-12-05 NOTE — Discharge Instructions (Signed)
You have been seen in the Emergency Department (ED) today for a fall.  Your work up does not show any concerning injuries.   ? ?Please follow up with your doctor regarding today's Emergency Department (ED) visit and your recent fall.   ? ?Return to the ED if you have any headache, confusion, slurred speech, weakness/numbness of any arm or leg, or any increased pain. ? ?

## 2018-12-05 NOTE — ED Triage Notes (Signed)
Pt arrived by [facility] from [facility], fall, hematoma right temporal area.  Hypertensive and on meds 220/102 bp 72 HR sinus 100% on RA Will move to memory care unit soon, fall risk No blood thinners

## 2018-12-05 NOTE — ED Notes (Signed)
Called [facility] for transport to [facility]

## 2018-12-30 ENCOUNTER — Emergency Department: Payer: Medicare Other

## 2018-12-30 ENCOUNTER — Other Ambulatory Visit: Payer: Self-pay

## 2018-12-30 ENCOUNTER — Emergency Department
Admission: EM | Admit: 2018-12-30 | Discharge: 2018-12-30 | Disposition: A | Discharge status: Home / Self Care | Payer: Medicare Other | Attending: Emergency Medicine | Admitting: Emergency Medicine

## 2018-12-30 DIAGNOSIS — I129 Hypertensive chronic kidney disease with stage 1 through stage 4 chronic kidney disease, or unspecified chronic kidney disease: Secondary | ICD-10-CM | POA: Insufficient documentation

## 2018-12-30 DIAGNOSIS — C50412 Malignant neoplasm of upper-outer quadrant of left female breast: Secondary | ICD-10-CM | POA: Diagnosis present

## 2018-12-30 DIAGNOSIS — Z79899 Other long term (current) drug therapy: Secondary | ICD-10-CM | POA: Diagnosis not present

## 2018-12-30 DIAGNOSIS — Z853 Personal history of malignant neoplasm of breast: Secondary | ICD-10-CM | POA: Insufficient documentation

## 2018-12-30 DIAGNOSIS — R4182 Altered mental status, unspecified: Secondary | ICD-10-CM | POA: Diagnosis present

## 2018-12-30 DIAGNOSIS — F0391 Unspecified dementia with behavioral disturbance: Secondary | ICD-10-CM | POA: Insufficient documentation

## 2018-12-30 DIAGNOSIS — N189 Chronic kidney disease, unspecified: Secondary | ICD-10-CM | POA: Insufficient documentation

## 2018-12-30 DIAGNOSIS — K219 Gastro-esophageal reflux disease without esophagitis: Secondary | ICD-10-CM | POA: Diagnosis present

## 2018-12-30 DIAGNOSIS — Z8679 Personal history of other diseases of the circulatory system: Secondary | ICD-10-CM | POA: Diagnosis not present

## 2018-12-30 DIAGNOSIS — M889 Osteitis deformans of unspecified bone: Secondary | ICD-10-CM | POA: Diagnosis present

## 2018-12-30 DIAGNOSIS — Z7984 Long term (current) use of oral hypoglycemic drugs: Secondary | ICD-10-CM | POA: Diagnosis not present

## 2018-12-30 DIAGNOSIS — E7800 Pure hypercholesterolemia, unspecified: Secondary | ICD-10-CM | POA: Diagnosis present

## 2018-12-30 DIAGNOSIS — C50919 Malignant neoplasm of unspecified site of unspecified female breast: Secondary | ICD-10-CM | POA: Diagnosis present

## 2018-12-30 DIAGNOSIS — E1122 Type 2 diabetes mellitus with diabetic chronic kidney disease: Secondary | ICD-10-CM | POA: Diagnosis not present

## 2018-12-30 DIAGNOSIS — I10 Essential (primary) hypertension: Secondary | ICD-10-CM | POA: Diagnosis present

## 2018-12-30 DIAGNOSIS — E039 Hypothyroidism, unspecified: Secondary | ICD-10-CM | POA: Diagnosis present

## 2018-12-30 DIAGNOSIS — E1149 Type 2 diabetes mellitus with other diabetic neurological complication: Secondary | ICD-10-CM | POA: Diagnosis present

## 2018-12-30 DIAGNOSIS — Z7982 Long term (current) use of aspirin: Secondary | ICD-10-CM | POA: Diagnosis not present

## 2018-12-30 DIAGNOSIS — I119 Hypertensive heart disease without heart failure: Secondary | ICD-10-CM | POA: Diagnosis present

## 2018-12-30 DIAGNOSIS — R4689 Other symptoms and signs involving appearance and behavior: Secondary | ICD-10-CM

## 2018-12-30 DIAGNOSIS — F0390 Unspecified dementia without behavioral disturbance: Secondary | ICD-10-CM | POA: Diagnosis present

## 2018-12-30 LAB — URINALYSIS, COMPLETE (UACMP) WITH MICROSCOPIC
Bacteria, UA: NONE SEEN
Bilirubin Urine: NEGATIVE
Glucose, UA: NEGATIVE mg/dL
Hgb urine dipstick: NEGATIVE
Ketones, ur: NEGATIVE mg/dL
Nitrite: NEGATIVE
Protein, ur: 30 mg/dL — AB
Specific Gravity, Urine: 1.011 (ref 1.005–1.030)
pH: 6 (ref 5.0–8.0)

## 2018-12-30 LAB — CBC
HCT: 37.9 % (ref 36.0–46.0)
Hemoglobin: 11.3 g/dL — ABNORMAL LOW (ref 12.0–15.0)
MCH: 26.9 pg (ref 26.0–34.0)
MCHC: 29.8 g/dL — ABNORMAL LOW (ref 30.0–36.0)
MCV: 90.2 fL (ref 80.0–100.0)
Platelets: 268 10*3/uL (ref 150–400)
RBC: 4.2 MIL/uL (ref 3.87–5.11)
RDW: 15.9 % — ABNORMAL HIGH (ref 11.5–15.5)
WBC: 9.7 10*3/uL (ref 4.0–10.5)
nRBC: 0 % (ref 0.0–0.2)

## 2018-12-30 LAB — COMPREHENSIVE METABOLIC PANEL
ALT: 13 U/L (ref 0–44)
AST: 28 U/L (ref 15–41)
Albumin: 4.2 g/dL (ref 3.5–5.0)
Alkaline Phosphatase: 168 U/L — ABNORMAL HIGH (ref 38–126)
Anion gap: 13 (ref 5–15)
BUN: 28 mg/dL — ABNORMAL HIGH (ref 8–23)
CO2: 22 mmol/L (ref 22–32)
Calcium: 9.3 mg/dL (ref 8.9–10.3)
Chloride: 108 mmol/L (ref 98–111)
Creatinine, Ser: 0.94 mg/dL (ref 0.44–1.00)
GFR calc Af Amer: >60 mL/min (ref >60)
GFR calc non Af Amer: 53 mL/min — ABNORMAL LOW (ref >60)
Glucose, Bld: 145 mg/dL — ABNORMAL HIGH (ref 70–99)
Potassium: 3.5 mmol/L (ref 3.5–5.1)
Sodium: 143 mmol/L (ref 135–145)
Total Bilirubin: 0.8 mg/dL (ref 0.3–1.2)
Total Protein: 7.2 g/dL (ref 6.5–8.1)

## 2018-12-30 LAB — BRAIN NATRIURETIC PEPTIDE: B Natriuretic Peptide: 192 pg/mL — ABNORMAL HIGH (ref 0.0–100.0)

## 2018-12-30 LAB — TROPONIN I (HIGH SENSITIVITY): Troponin I (High Sensitivity): 8 ng/L (ref <18)

## 2018-12-30 MED ORDER — HALOPERIDOL LACTATE 5 MG/ML IJ SOLN
INTRAMUSCULAR | Status: AC
  Administered 2018-12-30: 2.5 mg via INTRAMUSCULAR
  Filled 2018-12-30: qty 1

## 2018-12-30 MED ORDER — AMLODIPINE BESYLATE 5 MG PO TABS: 10.0000 mg | ORAL_TABLET | Freq: Every day | ORAL | Status: DC

## 2018-12-30 MED ORDER — QUETIAPINE FUMARATE 25 MG PO TABS: 25.0000 mg | ORAL_TABLET | Freq: Every day | ORAL | Status: DC

## 2018-12-30 MED ORDER — GLIMEPIRIDE 1 MG PO TABS
1.0000 mg | ORAL_TABLET | ORAL | Status: DC
  Filled 2018-12-30: qty 1

## 2018-12-30 MED ORDER — DIFLUPREDNATE 0.05 % OP EMUL: 1.0000 [drp] | Freq: Four times a day (QID) | OPHTHALMIC | Status: DC

## 2018-12-30 MED ORDER — MEMANTINE HCL 5 MG PO TABS: 5.0000 mg | ORAL_TABLET | Freq: Two times a day (BID) | ORAL | Status: DC

## 2018-12-30 MED ORDER — POLYVINYL ALCOHOL 1.4 % OP SOLN
1.0000 [drp] | Freq: Four times a day (QID) | OPHTHALMIC | Status: DC | PRN
  Filled 2018-12-30: qty 15

## 2018-12-30 MED ORDER — FUROSEMIDE 10 MG/ML IJ SOLN
20.0000 mg | Freq: Once | INTRAMUSCULAR | Status: AC
  Administered 2018-12-30: 15:00:00 20 mg via INTRAVENOUS
  Filled 2018-12-30: qty 4

## 2018-12-30 MED ORDER — HALOPERIDOL LACTATE 5 MG/ML IJ SOLN
2.5000 mg | Freq: Once | INTRAMUSCULAR | Status: AC
  Administered 2018-12-30: 13:00:00 2.5 mg via INTRAMUSCULAR

## 2018-12-30 MED ORDER — ASPIRIN 81 MG PO CHEW: 81.0000 mg | CHEWABLE_TABLET | Freq: Every day | ORAL | Status: DC

## 2018-12-30 MED ORDER — SERTRALINE HCL 50 MG PO TABS: 50.0000 mg | ORAL_TABLET | Freq: Every day | ORAL | Status: DC

## 2018-12-30 MED ORDER — DOCUSATE SODIUM 100 MG PO CAPS: 100.0000 mg | ORAL_CAPSULE | Freq: Two times a day (BID) | ORAL | Status: DC

## 2018-12-30 MED ORDER — KETOROLAC TROMETHAMINE 0.5 % OP SOLN
1.0000 [drp] | Freq: Four times a day (QID) | OPHTHALMIC | Status: DC
  Filled 2018-12-30: qty 3

## 2018-12-30 MED ORDER — LORATADINE 10 MG PO TABS: 10.0000 mg | ORAL_TABLET | Freq: Every day | ORAL | Status: DC | PRN

## 2018-12-30 MED ORDER — POLYSACCHARIDE IRON COMPLEX 150 MG PO CAPS: 150.0000 mg | ORAL_CAPSULE | Freq: Every day | ORAL | Status: DC

## 2018-12-30 MED ORDER — LEVOTHYROXINE SODIUM 125 MCG PO TABS
62.5000 ug | ORAL_TABLET | Freq: Every day | ORAL | Status: DC
  Filled 2018-12-30: qty 0.5

## 2018-12-30 NOTE — ED Provider Notes (Signed)
[facility] Emergency Department Provider Note  ____________________________________________   First MD Initiated Contact with Patient 12/30/18 1204     (approximate)  I have reviewed the triage vital signs and the nursing notes.   HISTORY  Chief Complaint Altered Mental Status    HPI Tamara Simon is a 85 y.o. female  With h/o dementia here with reported confusion. History limited 2/2 dementia.   Per caregiver, pt became significantly more confused and altered this morning. Last seen normal was last night. On waking up, she is confused, combative. She is normally pleasant. Caregiver denies known recent med changes. No falls overnight.  Level 5 caveat invoked as remainder of history, ROS, and physical exam limited due to patient's AMS.        Past Medical History:  Diagnosis Date  . Arthritis   . Breast cancer of upper-outer quadrant of left female breast [facility]) March 2016   Triple negative  . Chronic kidney disease   . Dementia ([place])   . Diabetes mellitus without complication ([place])   . GERD (gastroesophageal reflux disease)   . Hearing loss   . Hyperlipidemia   . Hypertension   . Left ventricular hypertrophy   . Paget's bone disease   . Thyroid disease     Patient Active Problem List   Diagnosis Date Noted  . Breast cancer ([place]) 09/20/2016  . Breast cancer of upper-outer quadrant of left female breast ([place]) 05/18/2014  . Pure hypercholesterolemia 04/29/2014  . DM (diabetes mellitus) type II controlled, neurological manifestation ([place]) 09/19/2013  . Benign essential hypertension 09/19/2013  . GERD (gastroesophageal reflux disease) 09/19/2013  . Hypothyroid 09/19/2013  . LVH (left ventricular hypertrophy) due to hypertensive disease 09/19/2013  . Paget's bone disease 09/19/2013    Past Surgical History:  Procedure Laterality Date  . CATARACT EXTRACTION W/PHACO Right 08/05/2018   Procedure: CATARACT EXTRACTION PHACO AND INTRAOCULAR  LENS PLACEMENT ([place])  RIGHT DIABETIC;  Surgeon: Leandrew Koyanagi, MD;  Location: [place];  Service: Ophthalmology;  Laterality: Right;  Diabetic - oral meds HAVING COVID TEST @ 8AM SO LEAVE ARRIVAL HERE  . HERNIA REPAIR    . partial mastectomy left breast  07/08/2014  . THYROID SURGERY  85 years old    Prior to Admission medications   Medication Sig Start Date End Date Taking? Authorizing Provider  amLODipine (NORVASC) 10 MG tablet Take 10 mg by mouth daily.   Yes [provider]  aspirin 81 MG chewable tablet Chew 81 mg by mouth daily.   Yes [provider]  Difluprednate 0.05 % EMUL Place 1 drop into both eyes 4 (four) times daily.   Yes [provider]  docusate sodium (COLACE) 100 MG capsule Take 100 mg by mouth 2 (two) times daily.    Yes [provider]  glimepiride (AMARYL) 1 MG tablet Take 1 mg by mouth every morning.    Yes [provider]  iron polysaccharides (NIFEREX) 150 MG capsule Take 150 mg by mouth daily.   Yes [provider]  ketorolac (ACULAR) 0.5 % ophthalmic solution Place 1 drop into both eyes 4 (four) times daily.   Yes [provider]  levothyroxine (SYNTHROID, LEVOTHROID) 125 MCG tablet Take 62.5 mcg by mouth daily before breakfast. Except for Saturday and Sunday   Yes [provider]  loratadine (CLARITIN) 10 MG tablet Take 10 mg by mouth daily as needed for allergies.   Yes [provider]  memantine (NAMENDA) 5 MG tablet Take  5 mg by mouth 2 (two) times daily.    Yes [provider]  polyvinyl alcohol (LIQUIFILM TEARS) 1.4 % ophthalmic solution Place 1 drop into the left eye 4 (four) times daily as needed for dry eyes.   Yes [provider]  QUEtiapine (SEROQUEL) 25 MG tablet Take 25 mg by mouth at bedtime.    Yes [provider]  sertraline (ZOLOFT) 50 MG tablet Take 50 mg by mouth daily. 03/12/18 03/12/19 Yes [provider]     Allergies Carvedilol, Clonidine, Spironolactone, and Terazosin  Family History  Problem Relation Age of Onset  . Breast cancer Sister     Social History Social History   Tobacco Use  . Smoking status: Never Smoker  . Smokeless tobacco: Never Used  Substance Use Topics  . Alcohol use: Not Currently    Alcohol/week: 0.0 standard drinks    Comment: wine at times- none since being in asst living  . Drug use: No    Review of Systems  Review of Systems  Unable to perform ROS: Dementia     ____________________________________________  PHYSICAL EXAM:      VITAL SIGNS: ED Triage Vitals [12/30/18 1126]  Enc Vitals Group     BP (!) 152/109     Pulse Rate 85     Resp 17     Temp (!) 97.3 F (36.3 C)     Temp Source Oral     SpO2 100 %     Weight 125 lb 10.6 oz (57 kg)     Height 5\' 4"  (1.626 m)     Head Circumference      Peak Flow      Pain Score      Pain Loc      Pain Edu?      Excl. in [place]?      Physical Exam Vitals signs and nursing note reviewed.  Constitutional:      General: She is not in acute distress.    Appearance: She is well-developed.  HENT:     Head: Normocephalic and atraumatic.  Eyes:     Conjunctiva/sclera: Conjunctivae normal.  Neck:     Musculoskeletal: Neck supple.  Cardiovascular:     Rate and Rhythm: Normal rate and regular rhythm.     Heart sounds: Normal heart sounds.  Pulmonary:     Effort: Pulmonary effort is normal. No respiratory distress.     Breath sounds: No wheezing.  Abdominal:     General: There is no distension.  Skin:    General: Skin is warm.     Capillary Refill: Capillary refill takes less than 2 seconds.     Findings: No rash.  Neurological:     Mental Status: She is alert. She is disoriented.     Motor: No abnormal muscle tone.     Comments: Combative, yelling at staff. Noted to MAE against gravity. Face is symmetric. Speech confused but intelligible.        ____________________________________________   LABS (all labs ordered are listed, but only abnormal results are displayed)  Labs Reviewed  COMPREHENSIVE METABOLIC PANEL - Abnormal; Notable for the following components:      Result Value   Glucose, Bld 145 (*)    BUN 28 (*)    Alkaline Phosphatase 168 (*)    GFR calc non Af Amer 53 (*)    All other components within normal limits  CBC - Abnormal; Notable for the following components:   Hemoglobin 11.3 (*)  MCHC 29.8 (*)    RDW 15.9 (*)    All other components within normal limits  BRAIN NATRIURETIC PEPTIDE - Abnormal; Notable for the following components:   B Natriuretic Peptide 192.0 (*)    All other components within normal limits  URINALYSIS, COMPLETE (UACMP) WITH MICROSCOPIC  CBG MONITORING, ED  TROPONIN I (HIGH SENSITIVITY)    ____________________________________________  EKG: None ________________________________________  RADIOLOGY All imaging, including plain films, CT scans, and ultrasounds, independently reviewed by me, and interpretations confirmed via formal radiology reads.  ED MD interpretation:   CT: NAICA CXR: Atypical infection vs edema vs interstitial thickening  Official radiology report(s): Ct Head Wo Contrast  Result Date: 12/30/2018 CLINICAL DATA:  Altered level of consciousness EXAM: CT HEAD WITHOUT CONTRAST TECHNIQUE: Contiguous axial images were obtained from the base of the skull through the vertex without intravenous contrast. COMPARISON:  December 05, 2018 FINDINGS: Brain: There is no acute intracranial hemorrhage, mass-effect, or edema. Gray-white differentiation is preserved. There is no extra-axial fluid collection. Ventricles and sulci are within normal limits in size and configuration. Confluent areas of hypoattenuation in the supratentorial white matter are nonspecific but probably reflect stable chronic microvascular ischemic changes. Vascular: There is atherosclerotic calcification at  the skull base. Skull: Calvarium is unremarkable. Sinuses/Orbits: No acute finding. Other: None. IMPRESSION: No acute intracranial hemorrhage, mass effect, or evidence of acute infarction. Stable chronic findings detailed above. Electronically Signed   By: Macy Mis M.D.   On: 12/30/2018 14:11   Dg Chest Portable 1 View  Result Date: 12/30/2018 CLINICAL DATA:  85 year old female with altered mental status. EXAM: PORTABLE CHEST 1 VIEW COMPARISON:  Chest radiograph dated 07/22/2016. FINDINGS: There is mild cardiomegaly. Diffuse interstitial prominence may represent edema or atypical pneumonia. Clinical correlation is recommended. No large pleural effusion. There is no pneumothorax. Atherosclerotic calcification of the aortic arch. No acute osseous pathology. Surgical clips noted over the left chest. IMPRESSION: 1. Mild cardiomegaly. 2. Diffuse interstitial prominence may represent edema or atypical pneumonia. Clinical correlation is recommended. Electronically Signed   By: Anner Crete M.D.   On: 12/30/2018 13:45    ____________________________________________  PROCEDURES   Procedure(s) performed (including Critical Care):  Procedures  ____________________________________________  INITIAL IMPRESSION / MDM / [place] / ED COURSE  As part of my medical decision making, I reviewed the following data within the [facility] notes reviewed and incorporated, Old chart reviewed, Notes from prior ED visits, and [place] Controlled Substance Database       *ROWAN LUEBBE was evaluated in Emergency Department on 12/30/2018 for the symptoms described in the history of present illness. She was evaluated in the context of the global COVID-19 pandemic, which necessitated consideration that the patient might be at risk for infection with the SARS-CoV-2 virus that causes COVID-19. Institutional protocols and algorithms that pertain to the evaluation of patients at risk  for COVID-19 are in a state of rapid change based on information released by regulatory bodies including the CDC and federal and state organizations. These policies and algorithms were followed during the patient's care in the ED.  Some ED evaluations and interventions may be delayed as a result of limited staffing during the pandemic.*  Clinical Course as of Dec 29 1724  Wed Dec 30, 2018  1336 DG Chest Portable 1 View [JE]  1438 85 yo F here with increased agitation and confusion. DDx is broad, includes delirium 2/2 occult infection, metabolic abnormality, less likely CVA/TIA vs seizure. No  signs of focal deficit. No recent med changes. Will check CT, CXR, labs.   [CI]  [id number] CXR with questionable atypical infection vs edema. BNP pending. No cough, no hypoxia, doubt PNA. COVID ordered. Labs o/w overall reassuring. BUN elevated, mild dehydration noted clinically. F/u urine. Pt did require IM haldol 2/2 agitation on arrival. May ultiamtely need geripsych eval pending work-up.   [CI]    Clinical Course User Index [CI] Duffy Bruce, MD [JE] Cherre Huger    Medical Decision Making:  As above. F/u UA, reassess after Haldol. If back to baseline, can treat and d/c. If she remains behaviorally difficult, consider geripsych eval. CXR with possible edema and BNP mildly elevated - h/o LVH and chronic HTN. Single dose lasix given. No ischemic changes on tele, trop neg.  ____________________________________________  FINAL CLINICAL IMPRESSION(S) / ED DIAGNOSES  Final diagnoses:  Behavior concern     MEDICATIONS GIVEN DURING THIS VISIT:  Medications  haloperidol lactate (HALDOL) injection 2.5 mg (2.5 mg Intramuscular Given 12/30/18 1239)  furosemide (LASIX) injection 20 mg (20 mg Intravenous Given 12/30/18 1527)     ED Discharge Orders    None       Note:  This document was prepared using Dragon voice recognition software and may include unintentional dictation errors.    Duffy Bruce, MD 12/30/18 1726

## 2018-12-30 NOTE — ED Notes (Signed)
Pt allowed staff to assist her to lay down on stretcher. Chaplain, security, and this RN at bedside. Caregiver remains at bedside with pt. Pt appears to be calm at this time.

## 2018-12-30 NOTE — ED Notes (Signed)
Patient resting in bed with eyes closed. Safety sitter remains at bedside.

## 2018-12-30 NOTE — ED Notes (Signed)
Pt resting in bed, NT at bedside

## 2018-12-30 NOTE — ED Notes (Signed)
Family updated at this time.

## 2018-12-30 NOTE — ED Notes (Signed)
NT at bedside sitting with pt.

## 2018-12-30 NOTE — ED Triage Notes (Signed)
Pt comes from [facility] with AMS. Caregiver with her states that she just is not herself. Caregiver states that her normal is walking and holding normal conversations. Hx of alzheimers. Caregiver states that she has been getting increasingly worse and they were thinking about moving her to the locked unit of [facility]. [place] was going to try to get urine and she became combative. Pt was unable to get urine in the hat at [facility].

## 2018-12-30 NOTE — ED Notes (Signed)
Patient is resting comfortably. 

## 2018-12-30 NOTE — ED Provider Notes (Addendum)
Patient reassessed.  No evidence UTI.  Remains hemodynamically stable.  No hypoxia but was agitated and aggressive with staff members.  Concerning for violent behavior in the setting of dementia.  Will consult psych to see if there is any medication management changes we can make.   Merlyn Lot, MD 12/30/18 2129  Patient reassessed.  Resting comfortably.  No agitation at this time she was evaluated by psychiatry at bedside.  No indication for medication changes.  Likely secondary to dementia possible sundowning.  To avoid any worsening of her dementia I do feel it be prudent to get her back to a familiar environment as she is not having any signs of hemodynamic instability or infectious process at this time.   Merlyn Lot, MD 12/30/18 2258

## 2018-12-31 DIAGNOSIS — F0390 Unspecified dementia without behavioral disturbance: Secondary | ICD-10-CM | POA: Diagnosis present

## 2018-12-31 DIAGNOSIS — R4182 Altered mental status, unspecified: Secondary | ICD-10-CM | POA: Diagnosis present

## 2018-12-31 NOTE — Consult Note (Signed)
[facility] Consult   Reason for Consult: Altered mental status Referring Physician: Dr. Ellender Hose Patient Identification: Tamara Simon MRN:  [id number] Principal Diagnosis: AMS (altered mental status) Diagnosis:  Principal Problem:   AMS (altered mental status) Active Problems:   Breast cancer of upper-outer quadrant of left female breast ([place])   Breast cancer ([place])   DM (diabetes mellitus) type II controlled, neurological manifestation ([place])   Benign essential hypertension   GERD (gastroesophageal reflux disease)   Hypothyroid   LVH (left ventricular hypertrophy) due to hypertensive disease   Paget's bone disease   Pure hypercholesterolemia   Dementia ([place])   Total Time spent with patient: 30 minutes  Subjective:   Tamara Simon is a 85 y.o. female patient presented to [facility] ED from [facility] with altered mental status (AMS).  Per the ED triage nursing note, the patient-caregiver explained that "she is just not herself." The caregiver was voicing that the patient has a history of Alzheimer's.  The patient's caregiver reports that she has been getting increasingly worse, and they were thinking about moving her to the facility's lock unit.  The patient's caregiver says that the patient is combative at times.  The patient had a urine analysis done, which the results showed she is negative for UTI. The patient was seen face-to-face by this provider; chart reviewed and consulted with Dr. Quentin Cornwall on 12/30/2018 due to the patient's care. It was discussed with the EDP that the patient does not meet criteria to be admitted to the geriatric psychiatric inpatient unit.  On evaluation, the patient is alert, calm, cooperative, and mood-congruent with affect. The patient is unable to respond to questions asked of her. Plan: The patient is not a safety risk to self or others and does not require geriatric psychiatric inpatient admission for stabilization and treatment.  HPI:  Per Dr. Ellender Hose; CAISLEY SCIARRETTA is a 85 y.o. female  With h/o dementia here with reported confusion. History limited 2/2 dementia.   Per caregiver, pt became significantly more confused and altered this morning. Last seen normal was last night. On waking up, she is confused, combative. She is normally pleasant. Caregiver denies known recent med changes. No falls overnight.  Past Psychiatric History:  Dementia  Risk to Self:  No Risk to Others:  No Prior Inpatient Therapy:  Unknown Prior Outpatient Therapy:  Yes  Past Medical History:  Past Medical History:  Diagnosis Date  . Arthritis   . Breast cancer of upper-outer quadrant of left female breast [facility]) March 2016   Triple negative  . Chronic kidney disease   . Dementia ([place])   . Diabetes mellitus without complication ([place])   . GERD (gastroesophageal reflux disease)   . Hearing loss   . Hyperlipidemia   . Hypertension   . Left ventricular hypertrophy   . Paget's bone disease   . Thyroid disease     Past Surgical History:  Procedure Laterality Date  . CATARACT EXTRACTION W/PHACO Right 08/05/2018   Procedure: CATARACT EXTRACTION PHACO AND INTRAOCULAR LENS PLACEMENT ([place])  RIGHT DIABETIC;  Surgeon: Leandrew Koyanagi, MD;  Location: [facility];  Service: Ophthalmology;  Laterality: Right;  Diabetic - oral meds HAVING COVID TEST @ 8AM SO LEAVE ARRIVAL HERE  . HERNIA REPAIR    . partial mastectomy left breast  07/08/2014  . THYROID SURGERY  85 years old   Family History:  Family History  Problem Relation Age of Onset  . Breast cancer Sister    Family  Psychiatric  History: Unknown Social History:  Social History   Substance and Sexual Activity  Alcohol Use Not Currently  . Alcohol/week: 0.0 standard drinks   Comment: wine at times- none since being in asst living     Social History   Substance and Sexual Activity  Drug Use No    Social History   Socioeconomic History  . Marital status: Widowed     Spouse name: Not on file  . Number of children: Not on file  . Years of education: Not on file  . Highest education level: Not on file  Occupational History  . Not on file  Social Needs  . Financial resource strain: Not on file  . Food insecurity    Worry: Not on file    Inability: Not on file  . Transportation needs    Medical: Not on file    Non-medical: Not on file  Tobacco Use  . Smoking status: Never Smoker  . Smokeless tobacco: Never Used  Substance and Sexual Activity  . Alcohol use: Not Currently    Alcohol/week: 0.0 standard drinks    Comment: wine at times- none since being in asst living  . Drug use: No  . Sexual activity: Not on file  Lifestyle  . Physical activity    Days per week: Not on file    Minutes per session: Not on file  . Stress: Not on file  Relationships  . Social Herbalist on phone: Not on file    Gets together: Not on file    Attends religious service: Not on file    Active member of club or organization: Not on file    Attends meetings of clubs or organizations: Not on file    Relationship status: Not on file  Other Topics Concern  . Not on file  Social History Narrative  . Not on file   Additional Social History:    Allergies:   Allergies  Allergen Reactions  . Carvedilol Other (See Comments)    Insomnia  . Clonidine Other (See Comments)    syncope  . Spironolactone Other (See Comments)    unknown  . Terazosin Other (See Comments)    unknown    Labs:  Results for orders placed or performed during the hospital encounter of 12/30/18 (from the past 48 hour(s))  Comprehensive metabolic panel     Status: Abnormal   Collection Time: 12/30/18 11:28 AM  Result Value Ref Range   Sodium 143 135 - 145 mmol/L   Potassium 3.5 3.5 - 5.1 mmol/L   Chloride 108 98 - 111 mmol/L   CO2 22 22 - 32 mmol/L   Glucose, Bld 145 (H) 70 - 99 mg/dL   BUN 28 (H) 8 - 23 mg/dL   Creatinine, Ser 0.94 0.44 - 1.00 mg/dL   Calcium 9.3 8.9 -  10.3 mg/dL   Total Protein 7.2 6.5 - 8.1 g/dL   Albumin 4.2 3.5 - 5.0 g/dL   AST 28 15 - 41 U/L   ALT 13 0 - 44 U/L   Alkaline Phosphatase 168 (H) 38 - 126 U/L   Total Bilirubin 0.8 0.3 - 1.2 mg/dL   GFR calc non Af Amer 53 (L) >60 mL/min   GFR calc Af Amer >60 >60 mL/min   Anion gap 13 5 - 15    Comment: Performed at [facility], [address] [place], [place] [address]  CBC     Status: Abnormal  Collection Time: 12/30/18 11:28 AM  Result Value Ref Range   WBC 9.7 4.0 - 10.5 K/uL   RBC 4.20 3.87 - 5.11 MIL/uL   Hemoglobin 11.3 (L) 12.0 - 15.0 g/dL   HCT 37.9 36.0 - 46.0 %   MCV 90.2 80.0 - 100.0 fL   MCH 26.9 26.0 - 34.0 pg   MCHC 29.8 (L) 30.0 - 36.0 g/dL   RDW 15.9 (H) 11.5 - 15.5 %   Platelets 268 150 - 400 K/uL   nRBC 0.0 0.0 - 0.2 %    Comment: Performed at [facility], [address] [place], [place] [address]  Brain natriuretic peptide     Status: Abnormal   Collection Time: 12/30/18 11:28 AM  Result Value Ref Range   B Natriuretic Peptide 192.0 (H) 0.0 - 100.0 pg/mL    Comment: Performed at [facility], [place], [place] [address]  Troponin I (High Sensitivity)     Status: None   Collection Time: 12/30/18 11:28 AM  Result Value Ref Range   Troponin I (High Sensitivity) 8 <18 ng/L    Comment: (NOTE) Elevated high sensitivity troponin I (hsTnI) values and significant  changes across serial measurements may suggest ACS but many other  chronic and acute conditions are known to elevate hsTnI results.  Refer to the "Links" section for chest pain algorithms and additional  guidance. Performed at [facility], [address] [place], [place] [address]   Urinalysis, Complete w Microscopic     Status: Abnormal   Collection Time: 12/30/18  5:54 PM  Result Value Ref Range   Color, Urine YELLOW (A) YELLOW   APPearance HAZY (A) CLEAR   Specific Gravity, Urine 1.011 1.005 - 1.030   pH 6.0 5.0 - 8.0   Glucose,  UA NEGATIVE NEGATIVE mg/dL   Hgb urine dipstick NEGATIVE NEGATIVE   Bilirubin Urine NEGATIVE NEGATIVE   Ketones, ur NEGATIVE NEGATIVE mg/dL   Protein, ur 30 (A) NEGATIVE mg/dL   Nitrite NEGATIVE NEGATIVE   Leukocytes,Ua TRACE (A) NEGATIVE   RBC / HPF 6-10 0 - 5 RBC/hpf   WBC, UA 0-5 0 - 5 WBC/hpf   Bacteria, UA NONE SEEN NONE SEEN   Squamous Epithelial / LPF 0-5 0 - 5    Comment: Performed at [facility], [address], [place],  [address]    Current Facility-Administered Medications  Medication Dose Route Frequency Provider Last Rate Last Dose  . amLODipine (NORVASC) tablet 10 mg  10 mg Oral Daily Merlyn Lot, MD      . aspirin chewable tablet 81 mg  81 mg Oral Daily Merlyn Lot, MD      . Difluprednate 0.05 % EMUL 1 drop  1 drop Both Eyes QID Merlyn Lot, MD      . docusate sodium (COLACE) capsule 100 mg  100 mg Oral BID Merlyn Lot, MD      . glimepiride (AMARYL) tablet 1 mg  1 mg Oral Particia Nearing, MD      . iron polysaccharides (NIFEREX) capsule 150 mg  150 mg Oral Daily Merlyn Lot, MD      . ketorolac (ACULAR) 0.5 % ophthalmic solution 1 drop  1 drop Both Eyes QID Merlyn Lot, MD      . levothyroxine (SYNTHROID) tablet 62.5 mcg  62.5 mcg Oral QAC breakfast Merlyn Lot, MD      . loratadine (CLARITIN) tablet 10 mg  10 mg Oral Daily PRN Merlyn Lot, MD      .  memantine (NAMENDA) tablet 5 mg  5 mg Oral BID Merlyn Lot, MD      . polyvinyl alcohol (LIQUIFILM TEARS) 1.4 % ophthalmic solution 1 drop  1 drop Left Eye QID PRN Merlyn Lot, MD      . QUEtiapine (SEROQUEL) tablet 25 mg  25 mg Oral Davene Costain, MD      . sertraline (ZOLOFT) tablet 50 mg  50 mg Oral Daily Merlyn Lot, MD       Current Outpatient Medications  Medication Sig Dispense Refill  . amLODipine (NORVASC) 10 MG tablet Take 10 mg by mouth daily.    Marland Kitchen aspirin 81 MG chewable tablet Chew 81 mg by mouth daily.    .  Difluprednate 0.05 % EMUL Place 1 drop into both eyes 4 (four) times daily.    Marland Kitchen docusate sodium (COLACE) 100 MG capsule Take 100 mg by mouth 2 (two) times daily.     Marland Kitchen glimepiride (AMARYL) 1 MG tablet Take 1 mg by mouth every morning.     . iron polysaccharides (NIFEREX) 150 MG capsule Take 150 mg by mouth daily.    Marland Kitchen ketorolac (ACULAR) 0.5 % ophthalmic solution Place 1 drop into both eyes 4 (four) times daily.    Marland Kitchen levothyroxine (SYNTHROID, LEVOTHROID) 125 MCG tablet Take 62.5 mcg by mouth daily before breakfast. Except for Saturday and Sunday    . loratadine (CLARITIN) 10 MG tablet Take 10 mg by mouth daily as needed for allergies.    . memantine (NAMENDA) 5 MG tablet Take 5 mg by mouth 2 (two) times daily.     . polyvinyl alcohol (LIQUIFILM TEARS) 1.4 % ophthalmic solution Place 1 drop into the left eye 4 (four) times daily as needed for dry eyes.    Marland Kitchen QUEtiapine (SEROQUEL) 25 MG tablet Take 25 mg by mouth at bedtime.     . sertraline (ZOLOFT) 50 MG tablet Take 50 mg by mouth daily.      Musculoskeletal: Strength & Muscle Tone: decreased Gait & Station: unsteady Patient leans: N/A  Psychiatric Specialty Exam: Physical Exam  Vitals reviewed. Constitutional: She appears well-developed.  Neck: Normal range of motion. Neck supple.  Respiratory: Effort normal.  Musculoskeletal:        General: Tenderness present.  Neurological: She is alert.  Psychiatric: She has a normal mood and affect. Her behavior is normal.    Review of Systems  Psychiatric/Behavioral: Positive for memory loss.  All other systems reviewed and are negative.   Blood pressure (!) 167/85, pulse 82, temperature (!) 97.5 F (36.4 C), temperature source Oral, resp. rate 16, height 5\' 4"  (1.626 m), weight 57 kg, SpO2 100 %.Body mass index is 21.57 kg/m.  General Appearance: Casual  Eye Contact:  Fair  Speech:  Garbled  Volume:  Decreased  Mood:  Euphoric  Affect:  Appropriate  Thought Process:  Disorganized   Orientation:  Other:  Patient is confused  Thought Content:  Delusions  Suicidal Thoughts:  Unable to assess.  Homicidal Thoughts:  Unable to assess.  Memory:  Immediate;   Negative Recent;   Negative Remote;   Negative  Judgement:  Impaired  Insight:  Lacking  Psychomotor Activity:  Decreased  Concentration:  Concentration: Poor and Attention Span: Poor  Recall:  Poor  Fund of Knowledge:  Poor  Language:  Poor  Akathisia:  Negative  Handed:  Right  AIMS (if indicated):     Assets:  Physical Health Resilience Others:  .  ADL's:  Impaired  Cognition:  Impaired,  Severe  Sleep:        Treatment Plan Summary: Medication management and Plan Patient does not meet criteria for geriatric psychiatric inpatient admission.  Disposition: No evidence of imminent risk to self or others at present.   Patient does not meet criteria for psychiatric inpatient admission. Supportive therapy provided about ongoing stressors.  Caroline Sauger, NP 12/31/2018 12:44 AM

## 2019-03-24 ENCOUNTER — Telehealth: Payer: Self-pay | Admitting: Oncology

## 2019-03-24 NOTE — Telephone Encounter (Signed)
Writer spoke with patient's family member, Kevan Ny on this date who stated that patient is currently at an [facility] and that due to [place], it would be very difficult to attend appt. Family member and MD are in agreement to move appt out 6 months. Appt rescheduled for Aug. 2021.

## 2019-03-24 NOTE — Telephone Encounter (Signed)
Writer spoke with patient's family member, Elder Love

## 2019-03-31 ENCOUNTER — Emergency Department
Admission: EM | Admit: 2019-03-31 | Discharge: 2019-03-31 | Disposition: A | Discharge status: Skilled Nursing Facility | Payer: Medicare Other | Attending: Emergency Medicine | Admitting: Emergency Medicine

## 2019-03-31 ENCOUNTER — Other Ambulatory Visit: Payer: Self-pay

## 2019-03-31 DIAGNOSIS — Z79899 Other long term (current) drug therapy: Secondary | ICD-10-CM | POA: Insufficient documentation

## 2019-03-31 DIAGNOSIS — F0390 Unspecified dementia without behavioral disturbance: Secondary | ICD-10-CM | POA: Diagnosis not present

## 2019-03-31 DIAGNOSIS — F0391 Unspecified dementia with behavioral disturbance: Secondary | ICD-10-CM

## 2019-03-31 DIAGNOSIS — R451 Restlessness and agitation: Secondary | ICD-10-CM | POA: Insufficient documentation

## 2019-03-31 LAB — URINALYSIS, COMPLETE (UACMP) WITH MICROSCOPIC
Bacteria, UA: NONE SEEN
Bilirubin Urine: NEGATIVE
Glucose, UA: NEGATIVE mg/dL
Hgb urine dipstick: NEGATIVE
Ketones, ur: NEGATIVE mg/dL
Leukocytes,Ua: NEGATIVE
Nitrite: NEGATIVE
Protein, ur: 100 mg/dL — AB
Specific Gravity, Urine: 1.018 (ref 1.005–1.030)
Squamous Epithelial / HPF: NONE SEEN (ref 0–5)
pH: 6 (ref 5.0–8.0)

## 2019-03-31 LAB — BASIC METABOLIC PANEL
Anion gap: 11 (ref 5–15)
BUN: 19 mg/dL (ref 8–23)
CO2: 24 mmol/L (ref 22–32)
Calcium: 8.8 mg/dL — ABNORMAL LOW (ref 8.9–10.3)
Chloride: 107 mmol/L (ref 98–111)
Creatinine, Ser: 1.04 mg/dL — ABNORMAL HIGH (ref 0.44–1.00)
GFR calc Af Amer: 54 mL/min — ABNORMAL LOW (ref >60)
GFR calc non Af Amer: 47 mL/min — ABNORMAL LOW (ref >60)
Glucose, Bld: 167 mg/dL — ABNORMAL HIGH (ref 70–99)
Potassium: 3.1 mmol/L — ABNORMAL LOW (ref 3.5–5.1)
Sodium: 142 mmol/L (ref 135–145)

## 2019-03-31 LAB — CBC
HCT: 31.8 % — ABNORMAL LOW (ref 36.0–46.0)
Hemoglobin: 10.5 g/dL — ABNORMAL LOW (ref 12.0–15.0)
MCH: 26.9 pg (ref 26.0–34.0)
MCHC: 33 g/dL (ref 30.0–36.0)
MCV: 81.5 fL (ref 80.0–100.0)
Platelets: 252 10*3/uL (ref 150–400)
RBC: 3.9 MIL/uL (ref 3.87–5.11)
RDW: 14.8 % (ref 11.5–15.5)
WBC: 4.6 10*3/uL (ref 4.0–10.5)
nRBC: 0 % (ref 0.0–0.2)

## 2019-03-31 MED ORDER — DIVALPROEX SODIUM 125 MG PO DR TAB
125.0000 mg | DELAYED_RELEASE_TABLET | Freq: Once | ORAL | Status: DC
  Filled 2019-03-31: qty 1

## 2019-03-31 MED ORDER — QUETIAPINE FUMARATE 25 MG PO TABS: 25.0000 mg | ORAL_TABLET | Freq: Three times a day (TID) | ORAL | 2 refills | Status: DC

## 2019-03-31 MED ORDER — QUETIAPINE FUMARATE 25 MG PO TABS: 25.0000 mg | ORAL_TABLET | Freq: Three times a day (TID) | ORAL | 0 refills | Status: DC

## 2019-03-31 MED ORDER — ZIPRASIDONE MESYLATE 20 MG IM SOLR
20.0000 mg | Freq: Once | INTRAMUSCULAR | Status: AC
  Administered 2019-03-31: 15:00:00 20 mg via INTRAMUSCULAR
  Filled 2019-03-31: qty 20

## 2019-03-31 MED ORDER — RISPERIDONE 1 MG PO TABS: 0.5000 mg | ORAL_TABLET | Freq: Once | ORAL | Status: DC

## 2019-03-31 NOTE — ED Notes (Signed)
Pt comes from [facility] where staff states she has had increasing agitation w/ combativeness since yesterday. Pt became agitated at the end of triage and unhooked herself wanting to "get out of here".

## 2019-03-31 NOTE — ED Triage Notes (Signed)
Pt arrived via [facility] from [facility] where staff stated she had exhibited escalating combative behavior since yesterday.

## 2019-03-31 NOTE — ED Notes (Signed)
Pt stating need to use bathroom. This tech and Mitch, RN in room to assist pt to use bathroom but pt unable to get put of bed.

## 2019-03-31 NOTE — ED Notes (Signed)
Report called to [facility]. Med transfer consent not signed by pt r/t hx of dementia, AMS.

## 2019-03-31 NOTE — ED Triage Notes (Signed)
Pt arrived from [facility] via [facility]. Staff called after pt refused to take meds and became combative.

## 2019-03-31 NOTE — Consult Note (Signed)
  Patient with severe dementia presenting with agitation in the group home.  Group home requesting additional medication to deal with agitation during the day as she responds well to Seroquel 25 nightly dose.  Will recommend to increase Seroquel 25 mg nightly dose to Seroquel 25 mg 3 times daily dosing.   Also recommend medical work-up including urinalysis to determine potential other causes of agitation

## 2019-03-31 NOTE — ED Notes (Signed)
Placed IV and collected lab specimens w/ assistance from [place]. Pt tolerated well.

## 2019-03-31 NOTE — ED Notes (Signed)
Pt became agitated unhooking herself from monitor and attempting to get out of bed. Charge nurse notified.
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
## 2019-03-31 NOTE — ED Notes (Signed)
This tech at bedside as 1:1 sitter. Pt attached to 5 lead cardiac monitor, SpO2 sensor and BP cuff.

## 2019-03-31 NOTE — ED Provider Notes (Signed)
Patient seems calm and cooperative at this time.  Has a stable medical work-up.  We will place her on 3 times a day Seroquel.   Earleen Newport, MD 03/31/19 [phone]

## 2019-03-31 NOTE — ED Notes (Signed)
Pt stating need to use bathroom again and pt more awake (kicking and agitated). This tech able to get pt to toilet with maximum assistance. Pt able to void and put back in bed.

## 2019-03-31 NOTE — ED Provider Notes (Addendum)
-----------------------------------------   2:07 PM on 03/31/2019 -----------------------------------------  Patient's note was documented under prior patient's name which is going to be merged with his current patient due to a paperwork mishap at the nursing facility.  We will attempt to dose the patient's home medications of Seroquel 25 mg.  We will continue to closely monitor.  Patient remains calm and cooperative in the emergency department.  Patient has become somewhat agitated refusing to get in bed.  We will dose Geodon for IM sedation we will consult psychiatry for medication recommendations for the patient.  Ultimately believe the patient will be able to be discharged back to her nursing facility.   Harvest Dark, MD 03/31/19 1429  Psychiatry has seen and evaluated the patient.  They believe the patient would benefit from 25 mg of Seroquel 3 times daily.  Patient is resting comfortably in bed after Geodon.  We will monitor in the emergency department with plan to discharge back to nursing facility as long as patient remains calm with a new prescription for Seroquel 3 times daily.  Patient care signed out to oncoming physician.   Harvest Dark, MD 03/31/19 1512  EKG viewed and interpreted by myself shows a normal sinus rhythm at 69 bpm with a narrow QRS, normal axis, normal intervals, no concerning ST changes.    Harvest Dark, MD 03/31/19 1520

## 2019-03-31 NOTE — ED Notes (Signed)
Called [facility] for transport to [place]

## 2019-04-01 ENCOUNTER — Ambulatory Visit: Payer: Medicare Other | Admitting: Oncology

## 2019-06-01 ENCOUNTER — Other Ambulatory Visit: Payer: Self-pay

## 2019-06-01 ENCOUNTER — Emergency Department
Admission: EM | Admit: 2019-06-01 | Discharge: 2019-06-02 | Disposition: A | Discharge status: Home / Self Care | Payer: Medicare Other | Attending: Emergency Medicine | Admitting: Emergency Medicine

## 2019-06-01 DIAGNOSIS — Z79899 Other long term (current) drug therapy: Secondary | ICD-10-CM | POA: Diagnosis not present

## 2019-06-01 DIAGNOSIS — F329 Major depressive disorder, single episode, unspecified: Secondary | ICD-10-CM | POA: Insufficient documentation

## 2019-06-01 DIAGNOSIS — R451 Restlessness and agitation: Secondary | ICD-10-CM | POA: Diagnosis present

## 2019-06-01 DIAGNOSIS — I129 Hypertensive chronic kidney disease with stage 1 through stage 4 chronic kidney disease, or unspecified chronic kidney disease: Secondary | ICD-10-CM | POA: Diagnosis not present

## 2019-06-01 DIAGNOSIS — E1122 Type 2 diabetes mellitus with diabetic chronic kidney disease: Secondary | ICD-10-CM | POA: Diagnosis not present

## 2019-06-01 DIAGNOSIS — F0391 Unspecified dementia with behavioral disturbance: Secondary | ICD-10-CM | POA: Diagnosis not present

## 2019-06-01 DIAGNOSIS — Z7982 Long term (current) use of aspirin: Secondary | ICD-10-CM | POA: Diagnosis not present

## 2019-06-01 DIAGNOSIS — N189 Chronic kidney disease, unspecified: Secondary | ICD-10-CM | POA: Diagnosis not present

## 2019-06-01 DIAGNOSIS — E119 Type 2 diabetes mellitus without complications: Secondary | ICD-10-CM

## 2019-06-01 DIAGNOSIS — E039 Hypothyroidism, unspecified: Secondary | ICD-10-CM | POA: Diagnosis not present

## 2019-06-01 DIAGNOSIS — Z7984 Long term (current) use of oral hypoglycemic drugs: Secondary | ICD-10-CM | POA: Insufficient documentation

## 2019-06-01 DIAGNOSIS — F0390 Unspecified dementia without behavioral disturbance: Secondary | ICD-10-CM | POA: Diagnosis present

## 2019-06-01 LAB — CBC
HCT: 31.2 % — ABNORMAL LOW (ref 36.0–46.0)
Hemoglobin: 10 g/dL — ABNORMAL LOW (ref 12.0–15.0)
MCH: 26.7 pg (ref 26.0–34.0)
MCHC: 32.1 g/dL (ref 30.0–36.0)
MCV: 83.4 fL (ref 80.0–100.0)
Platelets: 226 10*3/uL (ref 150–400)
RBC: 3.74 MIL/uL — ABNORMAL LOW (ref 3.87–5.11)
RDW: 15.8 % — ABNORMAL HIGH (ref 11.5–15.5)
WBC: 7 10*3/uL (ref 4.0–10.5)
nRBC: 0 % (ref 0.0–0.2)

## 2019-06-01 LAB — URINALYSIS, COMPLETE (UACMP) WITH MICROSCOPIC
Bacteria, UA: NONE SEEN
Bilirubin Urine: NEGATIVE
Glucose, UA: NEGATIVE mg/dL
Hgb urine dipstick: NEGATIVE
Ketones, ur: NEGATIVE mg/dL
Leukocytes,Ua: NEGATIVE
Nitrite: NEGATIVE
Protein, ur: 100 mg/dL — AB
Specific Gravity, Urine: 1.016 (ref 1.005–1.030)
pH: 7 (ref 5.0–8.0)

## 2019-06-01 LAB — COMPREHENSIVE METABOLIC PANEL
ALT: 14 U/L (ref 0–44)
AST: 29 U/L (ref 15–41)
Albumin: 4 g/dL (ref 3.5–5.0)
Alkaline Phosphatase: 171 U/L — ABNORMAL HIGH (ref 38–126)
Anion gap: 9 (ref 5–15)
BUN: 30 mg/dL — ABNORMAL HIGH (ref 8–23)
CO2: 25 mmol/L (ref 22–32)
Calcium: 9.2 mg/dL (ref 8.9–10.3)
Chloride: 109 mmol/L (ref 98–111)
Creatinine, Ser: 1.13 mg/dL — ABNORMAL HIGH (ref 0.44–1.00)
GFR calc Af Amer: 49 mL/min — ABNORMAL LOW (ref >60)
GFR calc non Af Amer: 42 mL/min — ABNORMAL LOW (ref >60)
Glucose, Bld: 82 mg/dL (ref 70–99)
Potassium: 4 mmol/L (ref 3.5–5.1)
Sodium: 143 mmol/L (ref 135–145)
Total Bilirubin: 0.6 mg/dL (ref 0.3–1.2)
Total Protein: 7.1 g/dL (ref 6.5–8.1)

## 2019-06-01 MED ORDER — HALOPERIDOL LACTATE 5 MG/ML IJ SOLN
5.0000 mg | Freq: Once | INTRAMUSCULAR | Status: AC
  Administered 2019-06-01: 5 mg via INTRAMUSCULAR
  Filled 2019-06-01: qty 1

## 2019-06-01 NOTE — ED Provider Notes (Signed)
[facility] Emergency Department Provider Note  ____________________________________________  Time seen: Approximately 7:34 AM  I have reviewed the triage vital signs and the nursing notes.   HISTORY  Chief Complaint Altered Mental Status    Level 5 Caveat: Portions of the History and Physical including HPI and review of systems are unable to be completely obtained due to patient being a poor historian due to advanced dementia  HPI Tamara Simon is a 86 y.o. female with a history of CKD, dementia, hypertension who comes to the ED from memory care due to agitation.  Patient is unable to provide any meaningful history.  She responds to questioning inappropriately, exhibits paranoia. No reported focal symptoms from facility or EMS.    Past Medical History:  Diagnosis Date  . Arthritis   . Breast cancer of upper-outer quadrant of left female breast [facility]) March 2016   Triple negative  . Chronic kidney disease   . Dementia ([place])   . Diabetes mellitus without complication ([place])   . GERD (gastroesophageal reflux disease)   . Hearing loss   . Hyperlipidemia   . Hypertension   . Left ventricular hypertrophy   . Paget's bone disease   . Thyroid disease      Patient Active Problem List   Diagnosis Date Noted  . Dementia ([place]) 12/31/2018  . AMS (altered mental status) 12/31/2018  . Breast cancer ([place]) 09/20/2016  . Breast cancer of upper-outer quadrant of left female breast ([place]) 05/18/2014  . Pure hypercholesterolemia 04/29/2014  . DM (diabetes mellitus) type II controlled, neurological manifestation ([place]) 09/19/2013  . Benign essential hypertension 09/19/2013  . GERD (gastroesophageal reflux disease) 09/19/2013  . Hypothyroid 09/19/2013  . LVH (left ventricular hypertrophy) due to hypertensive disease 09/19/2013  . Paget's bone disease 09/19/2013     Past Surgical History:  Procedure Laterality Date  . CATARACT EXTRACTION W/PHACO Right  08/05/2018   Procedure: CATARACT EXTRACTION PHACO AND INTRAOCULAR LENS PLACEMENT ([place])  RIGHT DIABETIC;  Surgeon: Leandrew Koyanagi, MD;  Location: [place];  Service: Ophthalmology;  Laterality: Right;  Diabetic - oral meds HAVING COVID TEST @ 8AM SO LEAVE ARRIVAL HERE  . HERNIA REPAIR    . partial mastectomy left breast  07/08/2014  . THYROID SURGERY  86 years old     Prior to Admission medications   Medication Sig Start Date End Date Taking? Authorizing Provider  amLODipine (NORVASC) 10 MG tablet Take 10 mg by mouth daily.    [provider]  aspirin 81 MG chewable tablet Chew 81 mg by mouth daily.    [provider]  Difluprednate 0.05 % EMUL Place 1 drop into both eyes 4 (four) times daily.    [provider]  docusate sodium (COLACE) 100 MG capsule Take 100 mg by mouth 2 (two) times daily.     [provider]  glimepiride (AMARYL) 1 MG tablet Take 1 mg by mouth every morning.     [provider]  iron polysaccharides (NIFEREX) 150 MG capsule Take 150 mg by mouth daily.    [provider]  ketorolac (ACULAR) 0.5 % ophthalmic solution Place 1 drop into both eyes 4 (four) times daily.    [provider]  levothyroxine (SYNTHROID, LEVOTHROID) 125 MCG tablet Take 62.5 mcg by mouth daily before breakfast. Except for Saturday and Sunday    [provider]  loratadine (CLARITIN) 10 MG tablet Take 10 mg by mouth daily as needed for allergies.    [provider]  memantine (NAMENDA) 5 MG tablet Take 5 mg by mouth 2 (two) times daily.     [provider]  polyvinyl alcohol (LIQUIFILM TEARS) 1.4 % ophthalmic solution Place 1 drop into the left eye 4 (four) times daily as needed for dry eyes.    [provider]  QUEtiapine (SEROQUEL) 25 MG tablet Take 1 tablet (25 mg total) by mouth 3 (three) times daily. 03/31/19   Harvest Dark, MD  QUEtiapine (SEROQUEL) 25 MG tablet Take 1 tablet (25  mg total) by mouth 3 (three) times daily. 03/31/19   Earleen Newport, MD  sertraline (ZOLOFT) 50 MG tablet Take 50 mg by mouth daily. 03/12/18 03/31/19  [provider]     Allergies Carvedilol, Clonidine, Spironolactone, and Terazosin   Family History  Problem Relation Age of Onset  . Breast cancer Sister     Social History Social History   Tobacco Use  . Smoking status: Never Smoker  . Smokeless tobacco: Never Used  Substance Use Topics  . Alcohol use: Not Currently    Alcohol/week: 0.0 standard drinks    Comment: wine at times- none since being in asst living  . Drug use: No    Review of Systems Level 5 Caveat: Portions of the History and Physical including HPI and review of systems are unable to be completely obtained due to patient being a poor historian due to advanced dementia  Constitutional:   No known fever.  ENT:   No rhinorrhea. Cardiovascular:   No chest pain or syncope. Respiratory:   No dyspnea or cough. Gastrointestinal:   Negative for abdominal pain, vomiting and diarrhea.  Musculoskeletal:   Negative for focal pain or swelling ____________________________________________   PHYSICAL EXAM:  VITAL SIGNS: ED Triage Vitals [06/01/19 0656]  Enc Vitals Group     BP (!) 173/86     Pulse Rate (!) 51     Resp 18     Temp 97.7 F (36.5 C)     Temp Source Oral     SpO2 95 %     Weight      Height      Head Circumference      Peak Flow      Pain Score      Pain Loc      Pain Edu?      Excl. in [place]?     Vital signs reviewed, nursing assessments reviewed.   Constitutional:   Awake and alert, not oriented. Non-toxic appearance. Eyes:   Conjunctivae are normal. EOMI. PERRL. ENT      Head:   Normocephalic and atraumatic.      Nose:   No congestion/rhinnorhea.       Mouth/Throat:   MMM, no pharyngeal erythema. No peritonsillar mass.       Neck:   No meningismus. Full ROM. Hematological/Lymphatic/Immunilogical:   No cervical  lymphadenopathy. Cardiovascular:   RRR. Symmetric bilateral radial and DP pulses.  No murmurs. Cap refill less than 2 seconds. Respiratory:   Normal respiratory effort without tachypnea/retractions. Breath sounds are clear and equal bilaterally. No wheezes/rales/rhonchi. Gastrointestinal:   Soft and nontender. Non distended. There is no CVA tenderness.  No rebound, rigidity, or guarding.  Musculoskeletal:   Normal range of motion in all extremities. No joint effusions.  No lower extremity tenderness.  No edema. Neurologic:   Normal speech, nonlinear language without aphasia.  Motor grossly intact, good strength and coordination. No acute focal neurologic deficits are appreciated.  Skin:  Skin is warm, dry and intact. No rash noted.  No petechiae, purpura, or bullae.  ____________________________________________    LABS (pertinent positives/negatives) (all labs ordered are listed, but only abnormal results are displayed) Labs Reviewed  COMPREHENSIVE METABOLIC PANEL - Abnormal; Notable for the following components:      Result Value   BUN 30 (*)    Creatinine, Ser 1.13 (*)    Alkaline Phosphatase 171 (*)    GFR calc non Af Amer 42 (*)    GFR calc Af Amer 49 (*)    All other components within normal limits  CBC - Abnormal; Notable for the following components:   RBC 3.74 (*)    Hemoglobin 10.0 (*)    HCT 31.2 (*)    RDW 15.8 (*)    All other components within normal limits  URINALYSIS, COMPLETE (UACMP) WITH MICROSCOPIC - Abnormal; Notable for the following components:   Color, Urine YELLOW (*)    APPearance CLOUDY (*)    Protein, ur 100 (*)    All other components within normal limits   ____________________________________________   EKG    ____________________________________________    RADIOLOGY  No results found.  ____________________________________________   PROCEDURES Procedures  ____________________________________________  DIFFERENTIAL DIAGNOSIS    Dehydration, electrolyte abnormality, UTI, dementia with behavioral disturbance  CLINICAL IMPRESSION / ASSESSMENT AND PLAN / ED COURSE  Medications ordered in the ED: Medications  haloperidol lactate (HALDOL) injection 5 mg (5 mg Intramuscular Given 06/01/19 0751)    Pertinent labs & imaging results that were available during my care of the patient were reviewed by me and considered in my medical decision making (see chart for details).   Tamara Simon was evaluated in Emergency Department on 06/01/2019 for the symptoms described in the history of present illness. She was evaluated in the context of the global COVID-19 pandemic, which necessitated consideration that the patient might be at risk for infection with the SARS-CoV-2 virus that causes COVID-19. Institutional protocols and algorithms that pertain to the evaluation of patients at risk for COVID-19 are in a state of rapid change based on information released by regulatory bodies including the CDC and federal and state organizations. These policies and algorithms were followed during the patient's care in the ED.   Patient sent to the ED for agitation.  No focal symptoms reported, unable to obtain any focal information from patient.  Exam is nonfocal and reassuring.  Vital signs unremarkable.  Documentation provided by facility reviewed, vitals at home have been essentially normal as documented by residential facility up through yesterday.  No high risk medications, no anticoagulants.  No evidence of trauma.  Doubt SDH, EDH, stroke, STEMI, PNA, sepsis.  ----------------------------------------- 11:06 AM on 06/01/2019 -----------------------------------------  Labs unremarkable.  Patient is medically stable for discharge but facility has expressed discomfort with the patient's current agitation.  I will consult psychiatry for medication recommendation.      ____________________________________________   FINAL CLINICAL  IMPRESSION(S) / ED DIAGNOSES    Final diagnoses:  Dementia with behavioral disturbance, unspecified dementia type ([place])  Type 2 diabetes mellitus without complication, without long-term current use of insulin [facility])     ED Discharge Orders    None      Portions of this note were generated with dragon dictation software. Dictation errors may occur despite best attempts at proofreading.   Carrie Mew, MD 06/01/19 1427

## 2019-06-01 NOTE — ED Notes (Signed)
Pt assisted to the restroom via wheelchair by this tech and Gerald Stabs, Therapist, sports. Pt not able to help at all; x2 person assist. Pt back in bed attempting to kick at staff and swing. Pt medicated and this tech was able to get pt to lay down in bed. Pt resting at this time. Will continue to monitor Q15 minute rounds.

## 2019-06-01 NOTE — Discharge Instructions (Addendum)
Per psychiatry recommendations we will discontinue your Seroquel and start you on haloperidol 2 mg twice daily

## 2019-06-01 NOTE — ED Notes (Signed)
Upon arrival of EMS to take pt back to the facility, pt became combative towards this Tech and EMS medics. RN Gerald Stabs and Camera operator made aware.

## 2019-06-01 NOTE — ED Notes (Signed)
PT  VOL/  SEEN  BY  DR  Satira Sark  MD  PENDING  DISPO

## 2019-06-01 NOTE — ED Notes (Signed)
Patient ambulated to bathroom with assist, had a large bowel movement and voided. Brief changed, peri care provided. Patient changed into [place] clothes. Placed in hall bed for observation and safety. Patient swinging and  Threw her wig at tech, was kicking and swinging her fist. Dr. Jimmye Norman at bedside to re-assess. meds ordered and administered.

## 2019-06-01 NOTE — ED Notes (Signed)
Pt assisted to toilet by this nurse and tech, Mayra. Pt requires 2 person assist and is unable to stand on own. Pt after returning to bed becomes agitated and attempts to kick and hit staff and attempts to get out of bed. MD notified, pt unable to be deescalated by verbal stimuli. Orders to follow

## 2019-06-01 NOTE — ED Notes (Signed)
Pt uncooperative and combative with EMS and staff when time to leave. MD notified and reports to keep pt here over night and observe and allow psych to reevaluate in the AM

## 2019-06-01 NOTE — ED Notes (Signed)
Patient ambulated to bathroom standby assist to provide urine sample. Patient confused. Initially spit in toilet and attempted to urinate on closed trash can. Redirected by staff. Back to bed without incident. Calm and cooperative although confused.

## 2019-06-01 NOTE — ED Notes (Signed)
This RN called patient's son to notify him of patient coming to the ED.  He stated that he would ask a local family member to come and sit with her because he is not local.

## 2019-06-01 NOTE — ED Notes (Signed)
Med Tech gave officer her phone number: [phone] in case we need more information.

## 2019-06-01 NOTE — Consult Note (Signed)
[place] [place] Psychiatry Consult   Reason for Consult: Agitation Referring Physician: ED MD Patient Identification: Tamara Simon MRN:  [id number] Principal Diagnosis: Dementia [facility]) Diagnosis:  Principal Problem:   Dementia ([place])   Total Time spent with patient: 15 minutes  Subjective:   Tamara Simon is a 86 y.o. female patient admitted with hx agitation HPI: Chief Complaint per ED MD Altered Mental Status  Level 5 Caveat: Portions of the History and Physical including HPI and review of systems are unable to be completely obtained due to patient being a poor historian due to advanced dementia  HPI Tamara Simon is a 86 y.o. female with a history of CKD, dementia, hypertension who comes to the ED from memory care due to agitation.  Patient is unable to provide any meaningful history.  She responds to questioning inappropriately, exhibits paranoia. No reported focal symptoms from facility or EMS  Past Psychiatric History:   Risk to Self:   Risk to Others:   Prior Inpatient Therapy:   Prior Outpatient Therapy:    Past Medical History:  Past Medical History:  Diagnosis Date  . Arthritis   . Breast cancer of upper-outer quadrant of left female breast [facility]) March 2016   Triple negative  . Chronic kidney disease   . Dementia ([place])   . Diabetes mellitus without complication ([place])   . GERD (gastroesophageal reflux disease)   . Hearing loss   . Hyperlipidemia   . Hypertension   . Left ventricular hypertrophy   . Paget's bone disease   . Thyroid disease     Past Surgical History:  Procedure Laterality Date  . CATARACT EXTRACTION W/PHACO Right 08/05/2018   Procedure: CATARACT EXTRACTION PHACO AND INTRAOCULAR LENS PLACEMENT ([place])  RIGHT DIABETIC;  Surgeon: Leandrew Koyanagi, MD;  Location: [place];  Service: Ophthalmology;  Laterality: Right;  Diabetic - oral meds HAVING COVID TEST @ 8AM SO LEAVE ARRIVAL HERE  . HERNIA REPAIR    . partial mastectomy  left breast  07/08/2014  . THYROID SURGERY  86 years old   Family History:  Family History  Problem Relation Age of Onset  . Breast cancer Sister    Family Psychiatric  History:  Social History:  Social History   Substance and Sexual Activity  Alcohol Use Not Currently  . Alcohol/week: 0.0 standard drinks   Comment: wine at times- none since being in asst living     Social History   Substance and Sexual Activity  Drug Use No    Social History   Socioeconomic History  . Marital status: Widowed    Spouse name: Not on file  . Number of children: Not on file  . Years of education: Not on file  . Highest education level: Not on file  Occupational History  . Not on file  Tobacco Use  . Smoking status: Never Smoker  . Smokeless tobacco: Never Used  Substance and Sexual Activity  . Alcohol use: Not Currently    Alcohol/week: 0.0 standard drinks    Comment: wine at times- none since being in asst living  . Drug use: No  . Sexual activity: Not on file  Other Topics Concern  . Not on file  Social History Narrative  . Not on file   Social Determinants of Health   Financial Resource Strain:   . Difficulty of Paying Living Expenses:   Food Insecurity:   . Worried About Charity fundraiser in the Last Year:   . Ran  Out of Food in the Last Year:   Transportation Needs:   . Lack of Transportation (Medical):   Mar[place] Kitchen Lack of Transportation (Non-Medical):   Physical Activity:   . Days of Exercise per Week:   . Minutes of Exercise per Session:   Stress:   . Feeling of Stress :   Social Connections:   . Frequency of Communication with Friends and Family:   . Frequency of Social Gatherings with Friends and Family:   . Attends Religious Services:   . Active Member of Clubs or Organizations:   . Attends Archivist Meetings:   Marland Kitchen Marital Status:    Additional Social History:    Allergies:   Allergies  Allergen Reactions  . Carvedilol Other (See Comments)     Insomnia  . Clonidine Other (See Comments)    syncope  . Spironolactone Other (See Comments)    unknown  . Terazosin Other (See Comments)    unknown    Labs:  Results for orders placed or performed during the hospital encounter of 06/01/19 (from the past 48 hour(s))  Comprehensive metabolic panel     Status: Abnormal   Collection Time: 06/01/19  7:11 AM  Result Value Ref Range   Sodium 143 135 - 145 mmol/L   Potassium 4.0 3.5 - 5.1 mmol/L   Chloride 109 98 - 111 mmol/L   CO2 25 22 - 32 mmol/L   Glucose, Bld 82 70 - 99 mg/dL    Comment: Glucose reference range applies only to samples taken after fasting for at least 8 hours.   BUN 30 (H) 8 - 23 mg/dL   Creatinine, Ser 1.13 (H) 0.44 - 1.00 mg/dL   Calcium 9.2 8.9 - 10.3 mg/dL   Total Protein 7.1 6.5 - 8.1 g/dL   Albumin 4.0 3.5 - 5.0 g/dL   AST 29 15 - 41 U/L   ALT 14 0 - 44 U/L   Alkaline Phosphatase 171 (H) 38 - 126 U/L   Total Bilirubin 0.6 0.3 - 1.2 mg/dL   GFR calc non Af Amer 42 (L) >60 mL/min   GFR calc Af Amer 49 (L) >60 mL/min   Anion gap 9 5 - 15    Comment: Performed at [facility], [address] [place], [place] [address]  CBC     Status: Abnormal   Collection Time: 06/01/19  7:11 AM  Result Value Ref Range   WBC 7.0 4.0 - 10.5 K/uL   RBC 3.74 (L) 3.87 - 5.11 MIL/uL   Hemoglobin 10.0 (L) 12.0 - 15.0 g/dL   HCT 31.2 (L) 36.0 - 46.0 %   MCV 83.4 80.0 - 100.0 fL   MCH 26.7 26.0 - 34.0 pg   MCHC 32.1 30.0 - 36.0 g/dL   RDW 15.8 (H) 11.5 - 15.5 %   Platelets 226 150 - 400 K/uL   nRBC 0.0 0.0 - 0.2 %    Comment: Performed at [facility], [address] [place],  [address]  Urinalysis, Complete w Microscopic     Status: Abnormal   Collection Time: 06/01/19  8:20 AM  Result Value Ref Range   Color, Urine YELLOW (A) YELLOW   APPearance CLOUDY (A) CLEAR   Specific Gravity, Urine 1.016 1.005 - 1.030   pH 7.0 5.0 - 8.0   Glucose, UA NEGATIVE NEGATIVE mg/dL   Hgb urine dipstick  NEGATIVE NEGATIVE   Bilirubin Urine NEGATIVE NEGATIVE   Ketones, ur NEGATIVE NEGATIVE mg/dL   Protein, ur  100 (A) NEGATIVE mg/dL   Nitrite NEGATIVE NEGATIVE   Leukocytes,Ua NEGATIVE NEGATIVE   RBC / HPF 0-5 0 - 5 RBC/hpf   WBC, UA 11-20 0 - 5 WBC/hpf   Bacteria, UA NONE SEEN NONE SEEN   Squamous Epithelial / LPF 0-5 0 - 5   Mucus PRESENT    Hyaline Casts, UA PRESENT     Comment: Performed at [facility], [address] [place], [place] [address]    No current facility-administered medications for this encounter.   No current outpatient medications on file.   Patient is non-verbal and unable to provide a history. There is no sign of agitation or a psychotic process. In general she is resting comfortably  Psychiatric Specialty Exam: Physical Exam  Review of Systems  Blood pressure (!) 173/86, pulse (!) 51, temperature 97.7 F (36.5 C), temperature source Oral, resp. rate 18, height 5\' 3"  (1.6 m), weight 50 kg, SpO2 95 %.Body mass index is 19.53 kg/m.  General Appearance: Disheveled  Eye Contact:  Absent  Speech:  NA  Volume:  non verbal  Mood:  NA  Affect:  NA  Thought Process: unable to assess  Orientation:  Negative  Thought Content:  Negative  Suicidal Thoughts: unable to assess  Homicidal Thoughts:unable to assess  Memory: unable to assess  Judgement: unable to assess  Insight: unable to assess  Psychomotor Activity:  Decreased  Concentration:  Concentration: Poor  Recall: unable to assess  Fund of Knowledge: unable to assess  Language: unable to assess  Akathisia:  No  Handed: unable to assess  AIMS (if indicated):     Assets:  Others:  Nursing home support  ADL's:  Impaired  Cognition:  Impaired,  Severe  Sleep:      Agitation is episodic per history.   Treatment Plan Summary: Her medications were adjusted last time and are optimal at this time. Further increase would likely increase the risk of fall and lead to further sedation  Disposition:  Patient does not meet criteria for psychiatric inpatient admission. D/C back to nursing home if medically stable and agitation is not related to an obvious medical etiology.  Alesia Morin, MD 06/01/2019 4:25 PM

## 2019-06-01 NOTE — ED Notes (Signed)
Assumed care of patient, patient from NH with aggressive behavior. Currently sitting in recliner, calm and cooperative. Awaiting psych eval and plan of care. Safety maintained.

## 2019-06-01 NOTE — ED Triage Notes (Addendum)
Pt to triage via w/c; EMS brought pt in from [facility] due to aggression, combativeness and spitting; ativan admin without relief of symptoms; pt is awake & alert,does not answer questions directly but is yelling and grimacing at nurse; unable to complete triage at this time

## 2019-06-01 NOTE — ED Notes (Signed)
Report called to [place] at [employer] for [place]

## 2019-06-01 NOTE — ED Notes (Signed)
This nurse contacted pts son, Timera Hoblit to inform him of pt DC. He states he is approving DC

## 2019-06-01 NOTE — ED Notes (Signed)
Son notified of pt plan of care at this time

## 2019-06-01 NOTE — ED Notes (Signed)
Sleeping comfortably

## 2019-06-02 MED ORDER — HALOPERIDOL 2 MG PO TABS: 2.0000 mg | ORAL_TABLET | Freq: Two times a day (BID) | ORAL | 0 refills | Status: DC

## 2019-06-02 MED ORDER — HALOPERIDOL 2 MG PO TABS
2.0000 mg | ORAL_TABLET | Freq: Two times a day (BID) | ORAL | Status: DC
  Administered 2019-06-02: 2 mg via ORAL
  Filled 2019-06-02 (×2): qty 1

## 2019-06-02 NOTE — ED Notes (Signed)
Hourly rounding reveals patient sleeping in room. No complaints, stable, in no acute distress. Q15 minute rounds and monitoring via Security to continue. 

## 2019-06-02 NOTE — ED Notes (Signed)
Spoke with patients son Prisilla Kesinger and informed that patient will be discharged back to [facility], he appeared to be ok with it Also informed patient was changed from Seroquel to Haldol PO

## 2019-06-02 NOTE — Consult Note (Signed)
[facility] Consult   Reason for Consult: Agitation Referring Physician: ED MD Patient Identification: Tamara Simon MRN:  [id number] Principal Diagnosis: Dementia [facility]) Diagnosis:  Principal Problem:   Dementia ([place])   Total Time spent with patient: 15 minutes  Subjective:   Tamara Simon is a 86 y.o. female patient admitted with hx agitation  The patient continues to be episodically agitated in a random fashion.  HPI: Chief Complaint per ED MD Altered Mental Status Level 5 Caveat: Portions of the History and Physical including HPI and review of systems are unable to be completely obtained due to patient being a poor historian due to advanced dementia  HPI Tamara Simon is a 86 y.o. female with a history of CKD, dementia, hypertension who comes to the ED from memory care due to agitation.  Patient is unable to provide any meaningful history.  She responds to questioning inappropriately, exhibits paranoia. No reported focal symptoms from facility or EMS  Past Psychiatric History:   Risk to Self:   Risk to Others:   Prior Inpatient Therapy:   Prior Outpatient Therapy:    Past Medical History:  Past Medical History:  Diagnosis Date  . Arthritis   . Breast cancer of upper-outer quadrant of left female breast [facility]) March 2016   Triple negative  . Chronic kidney disease   . Dementia ([place])   . Diabetes mellitus without complication ([place])   . GERD (gastroesophageal reflux disease)   . Hearing loss   . Hyperlipidemia   . Hypertension   . Left ventricular hypertrophy   . Paget's bone disease   . Thyroid disease     Past Surgical History:  Procedure Laterality Date  . CATARACT EXTRACTION W/PHACO Right 08/05/2018   Procedure: CATARACT EXTRACTION PHACO AND INTRAOCULAR LENS PLACEMENT ([place])  RIGHT DIABETIC;  Surgeon: Leandrew Koyanagi, MD;  Location: [place];  Service: Ophthalmology;  Laterality: Right;  Diabetic - oral meds HAVING COVID  TEST @ 8AM SO LEAVE ARRIVAL HERE  . HERNIA REPAIR    . partial mastectomy left breast  07/08/2014  . THYROID SURGERY  86 years old   Family History:  Family History  Problem Relation Age of Onset  . Breast cancer Sister    Family Psychiatric  History:  Social History:  Social History   Substance and Sexual Activity  Alcohol Use Not Currently  . Alcohol/week: 0.0 standard drinks   Comment: wine at times- none since being in asst living     Social History   Substance and Sexual Activity  Drug Use No    Social History   Socioeconomic History  . Marital status: Widowed    Spouse name: Not on file  . Number of children: Not on file  . Years of education: Not on file  . Highest education level: Not on file  Occupational History  . Not on file  Tobacco Use  . Smoking status: Never Smoker  . Smokeless tobacco: Never Used  Substance and Sexual Activity  . Alcohol use: Not Currently    Alcohol/week: 0.0 standard drinks    Comment: wine at times- none since being in asst living  . Drug use: No  . Sexual activity: Not on file  Other Topics Concern  . Not on file  Social History Narrative  . Not on file   Social Determinants of Health   Financial Resource Strain:   . Difficulty of Paying Living Expenses:   Food Insecurity:   . Worried About  Running Out of Food in the Last Year:   . [place] in the Last Year:   Transportation Needs:   . Lack of Transportation (Medical):   Marland Kitchen Lack of Transportation (Non-Medical):   Physical Activity:   . Days of Exercise per Week:   . Minutes of Exercise per Session:   Stress:   . Feeling of Stress :   Social Connections:   . Frequency of Communication with Friends and Family:   . Frequency of Social Gatherings with Friends and Family:   . Attends Religious Services:   . Active Member of Clubs or Organizations:   . Attends Archivist Meetings:   Marland Kitchen Marital Status:    Additional Social History:    Allergies:    Allergies  Allergen Reactions  . Carvedilol Other (See Comments)    Insomnia  . Clonidine Other (See Comments)    syncope  . Spironolactone Other (See Comments)    unknown  . Terazosin Other (See Comments)    unknown    Labs:  Results for orders placed or performed during the hospital encounter of 06/01/19 (from the past 48 hour(s))  Comprehensive metabolic panel     Status: Abnormal   Collection Time: 06/01/19  7:11 AM  Result Value Ref Range   Sodium 143 135 - 145 mmol/L   Potassium 4.0 3.5 - 5.1 mmol/L   Chloride 109 98 - 111 mmol/L   CO2 25 22 - 32 mmol/L   Glucose, Bld 82 70 - 99 mg/dL    Comment: Glucose reference range applies only to samples taken after fasting for at least 8 hours.   BUN 30 (H) 8 - 23 mg/dL   Creatinine, Ser 1.13 (H) 0.44 - 1.00 mg/dL   Calcium 9.2 8.9 - 10.3 mg/dL   Total Protein 7.1 6.5 - 8.1 g/dL   Albumin 4.0 3.5 - 5.0 g/dL   AST 29 15 - 41 U/L   ALT 14 0 - 44 U/L   Alkaline Phosphatase 171 (H) 38 - 126 U/L   Total Bilirubin 0.6 0.3 - 1.2 mg/dL   GFR calc non Af Amer 42 (L) >60 mL/min   GFR calc Af Amer 49 (L) >60 mL/min   Anion gap 9 5 - 15    Comment: Performed at [facility], [address] [place], [place] [address]  CBC     Status: Abnormal   Collection Time: 06/01/19  7:11 AM  Result Value Ref Range   WBC 7.0 4.0 - 10.5 K/uL   RBC 3.74 (L) 3.87 - 5.11 MIL/uL   Hemoglobin 10.0 (L) 12.0 - 15.0 g/dL   HCT 31.2 (L) 36.0 - 46.0 %   MCV 83.4 80.0 - 100.0 fL   MCH 26.7 26.0 - 34.0 pg   MCHC 32.1 30.0 - 36.0 g/dL   RDW 15.8 (H) 11.5 - 15.5 %   Platelets 226 150 - 400 K/uL   nRBC 0.0 0.0 - 0.2 %    Comment: Performed at [facility], [address] [place], [place] [address]  Urinalysis, Complete w Microscopic     Status: Abnormal   Collection Time: 06/01/19  8:20 AM  Result Value Ref Range   Color, Urine YELLOW (A) YELLOW   APPearance CLOUDY (A) CLEAR   Specific Gravity, Urine 1.016 1.005 - 1.030   pH 7.0 5.0  - 8.0   Glucose, UA NEGATIVE NEGATIVE mg/dL   Hgb urine dipstick NEGATIVE NEGATIVE   Bilirubin Urine NEGATIVE  NEGATIVE   Ketones, ur NEGATIVE NEGATIVE mg/dL   Protein, ur 100 (A) NEGATIVE mg/dL   Nitrite NEGATIVE NEGATIVE   Leukocytes,Ua NEGATIVE NEGATIVE   RBC / HPF 0-5 0 - 5 RBC/hpf   WBC, UA 11-20 0 - 5 WBC/hpf   Bacteria, UA NONE SEEN NONE SEEN   Squamous Epithelial / LPF 0-5 0 - 5   Mucus PRESENT    Hyaline Casts, UA PRESENT     Comment: Performed at [facility], [address], [place], [place] [address]    Current Facility-Administered Medications  Medication Dose Route Frequency Provider Last Rate Last Admin  . haloperidol (HALDOL) tablet 2 mg  2 mg Oral BID Duffy Bruce, MD   2 mg at 06/02/19 1146   No current outpatient medications on file.   Patient is non-verbal and unable to provide a history. There is no sign of agitation or a psychotic process. In general she is resting comfortably  Psychiatric Specialty Exam: Physical Exam  Review of Systems  Blood pressure (!) 139/57, pulse 60, temperature (!) 97.4 F (36.3 C), temperature source Oral, resp. rate 18, height 5\' 3"  (1.6 m), weight 50 kg, SpO2 98 %.Body mass index is 19.53 kg/m.  General Appearance: Disheveled  Eye Contact:  Absent  Speech:  Garbled  Volume:  Decreased  Mood:  NA  Affect:  NA  Thought Process: unable to assess, with bouts of agitation. Calm when I saw her  Orientation:  Negative  Thought Content:  Negative  Suicidal Thoughts: unable to assess  Homicidal Thoughts:unable to assess  Memory: unable to assess  Judgement: unable to assess  Insight: unable to assess  Psychomotor Activity:  TD? Of the mouth.  Concentration:  Concentration: Poor  Recall: unable to assess  Fund of Knowledge: unable to assess  Language: unable to assess  Akathisia:  No  Handed: unable to assess  AIMS (if indicated):     Assets:  Others:  Nursing home support  ADL's:  Impaired  Cognition:   Impaired,  Severe  Sleep:       Treatment Plan Summary: The patient has received haloperidol 5 mg for agitation with apparently very good effect.  I thus discussed with the ED MD the appropriateness of switching away from her current treatment with quetiapine to haloperidol at a small dose of 2 mg twice a day.  If the agitation returns then a slight further increase in the dose may be appropriate.  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  This is the reason why I would prefer that we stay with a low dose and not rapidly increase the dose.  At this point there is no perfect solution since as discussed before we are caught between agitation which limits her ability to remain in her care stating alongside her risk of oversedation and the potential fall.  Assuming the agitation remains under control then discharge back to her nursing home care would be most appropriate  Disposition: Patient does not meet criteria for psychiatric inpatient admission. D/C back to nursing home if medically stable and agitation is not related to an obvious medical etiology.  Alesia Morin, MD 06/02/2019 12:21 PM

## 2019-06-02 NOTE — ED Notes (Signed)
Pt given lunch tray.

## 2019-06-02 NOTE — ED Notes (Signed)
Informed Tamela Oddi (staff) [phone] at [facility] and informed her that EMS will be bringing patient back at no specific time

## 2019-06-02 NOTE — ED Notes (Signed)
Called [facility] for transport back to [facility]

## 2019-06-02 NOTE — ED Notes (Signed)
Pt assisted to the bathroom by this tech and RN jadeka.

## 2019-06-02 NOTE — ED Notes (Signed)
Pt attempting to get out of bed at this time. Pt was redirected but unable to communicate. Pt stares at pt with no expression on face when spoken to. Blanket again placed on pt for comfort

## 2019-06-02 NOTE — ED Provider Notes (Signed)
The patient has been evaluated at bedside by Dr Satira Sark, psychiatry.  Patient is clinically stable.  Not felt to be a danger to self or others.  No SI or Hi.  No indication for inpatient psychiatric admission at this time.  Will make recommended changes including discontinuing seroquel and starting haldol 2mg  BID.  Rx sent to pharmacy. Appropriate for continued outpatient therapy.    Merlyn Lot, MD 06/02/19 [phone]

## 2019-06-02 NOTE — ED Notes (Signed)
Spoke with Tamela Oddi at [facility] and she asked that if we could transport patient back due to their shortage of transportation. Called ED secretary

## 2019-06-02 NOTE — ED Notes (Signed)
Patient transferred back to [facility] with [facility], patient and EMS received transfer papers. Patient received belongings. Patient appropriate and cooperative, Denies SI/HI AVH. Vital signs taken. NAD noted.

## 2019-06-02 NOTE — ED Notes (Signed)
Patient attempted to stand on own, staff assisted. Patient ambulated to bathroom with staff assist and wheelchair, had a bowel movement and voided. Peri care provided.

## 2019-06-30 ENCOUNTER — Emergency Department (HOSPITAL_COMMUNITY)
Admission: EM | Admit: 2019-06-30 | Discharge: 2019-06-30 | Disposition: A | Discharge status: Home / Self Care | Payer: Medicare Other | Attending: Emergency Medicine | Admitting: Emergency Medicine

## 2019-06-30 ENCOUNTER — Encounter (HOSPITAL_COMMUNITY): Payer: Self-pay | Admitting: Radiology

## 2019-06-30 ENCOUNTER — Emergency Department (HOSPITAL_COMMUNITY): Payer: Medicare Other

## 2019-06-30 DIAGNOSIS — Z79899 Other long term (current) drug therapy: Secondary | ICD-10-CM | POA: Insufficient documentation

## 2019-06-30 DIAGNOSIS — Z20822 Contact with and (suspected) exposure to covid-19: Secondary | ICD-10-CM | POA: Diagnosis not present

## 2019-06-30 DIAGNOSIS — E1122 Type 2 diabetes mellitus with diabetic chronic kidney disease: Secondary | ICD-10-CM | POA: Diagnosis not present

## 2019-06-30 DIAGNOSIS — N189 Chronic kidney disease, unspecified: Secondary | ICD-10-CM | POA: Insufficient documentation

## 2019-06-30 DIAGNOSIS — Z7982 Long term (current) use of aspirin: Secondary | ICD-10-CM | POA: Insufficient documentation

## 2019-06-30 DIAGNOSIS — R82998 Other abnormal findings in urine: Secondary | ICD-10-CM | POA: Insufficient documentation

## 2019-06-30 DIAGNOSIS — R4689 Other symptoms and signs involving appearance and behavior: Secondary | ICD-10-CM | POA: Diagnosis present

## 2019-06-30 DIAGNOSIS — R410 Disorientation, unspecified: Secondary | ICD-10-CM | POA: Insufficient documentation

## 2019-06-30 DIAGNOSIS — F0391 Unspecified dementia with behavioral disturbance: Secondary | ICD-10-CM | POA: Diagnosis not present

## 2019-06-30 DIAGNOSIS — Z853 Personal history of malignant neoplasm of breast: Secondary | ICD-10-CM | POA: Diagnosis not present

## 2019-06-30 DIAGNOSIS — I129 Hypertensive chronic kidney disease with stage 1 through stage 4 chronic kidney disease, or unspecified chronic kidney disease: Secondary | ICD-10-CM | POA: Insufficient documentation

## 2019-06-30 DIAGNOSIS — R296 Repeated falls: Secondary | ICD-10-CM | POA: Diagnosis not present

## 2019-06-30 DIAGNOSIS — E039 Hypothyroidism, unspecified: Secondary | ICD-10-CM | POA: Diagnosis not present

## 2019-06-30 LAB — URINALYSIS, ROUTINE W REFLEX MICROSCOPIC
Bilirubin Urine: NEGATIVE
Glucose, UA: NEGATIVE mg/dL
Ketones, ur: 20 mg/dL — AB
Leukocytes,Ua: NEGATIVE
Nitrite: NEGATIVE
Protein, ur: 100 mg/dL — AB
Specific Gravity, Urine: 1.024 (ref 1.005–1.030)
pH: 5 (ref 5.0–8.0)

## 2019-06-30 LAB — CBC WITH DIFFERENTIAL/PLATELET
Abs Immature Granulocytes: 0.03 10*3/uL (ref 0.00–0.07)
Basophils Absolute: 0 10*3/uL (ref 0.0–0.1)
Basophils Relative: 1 %
Eosinophils Absolute: 0 10*3/uL (ref 0.0–0.5)
Eosinophils Relative: 0 %
HCT: 34.4 % — ABNORMAL LOW (ref 36.0–46.0)
Hemoglobin: 11 g/dL — ABNORMAL LOW (ref 12.0–15.0)
Immature Granulocytes: 0 %
Lymphocytes Relative: 8 %
Lymphs Abs: 0.7 10*3/uL (ref 0.7–4.0)
MCH: 27.6 pg (ref 26.0–34.0)
MCHC: 32 g/dL (ref 30.0–36.0)
MCV: 86.2 fL (ref 80.0–100.0)
Monocytes Absolute: 0.6 10*3/uL (ref 0.1–1.0)
Monocytes Relative: 8 %
Neutro Abs: 6.5 10*3/uL (ref 1.7–7.7)
Neutrophils Relative %: 83 %
Platelets: 199 10*3/uL (ref 150–400)
RBC: 3.99 MIL/uL (ref 3.87–5.11)
RDW: 15.1 % (ref 11.5–15.5)
WBC: 7.9 10*3/uL (ref 4.0–10.5)
nRBC: 0 % (ref 0.0–0.2)

## 2019-06-30 LAB — BASIC METABOLIC PANEL
Anion gap: 15 (ref 5–15)
BUN: 27 mg/dL — ABNORMAL HIGH (ref 8–23)
CO2: 24 mmol/L (ref 22–32)
Calcium: 9.6 mg/dL (ref 8.9–10.3)
Chloride: 109 mmol/L (ref 98–111)
Creatinine, Ser: 0.92 mg/dL (ref 0.44–1.00)
GFR calc Af Amer: >60 mL/min (ref >60)
GFR calc non Af Amer: 54 mL/min — ABNORMAL LOW (ref >60)
Glucose, Bld: 94 mg/dL (ref 70–99)
Potassium: 3.6 mmol/L (ref 3.5–5.1)
Sodium: 148 mmol/L — ABNORMAL HIGH (ref 135–145)

## 2019-06-30 LAB — SARS CORONAVIRUS 2 BY RT PCR (HOSPITAL ORDER, PERFORMED IN ~~LOC~~ HOSPITAL LAB): SARS Coronavirus 2: NEGATIVE

## 2019-06-30 MED ORDER — HALOPERIDOL LACTATE 5 MG/ML IJ SOLN
5.0000 mg | Freq: Once | INTRAMUSCULAR | Status: AC
  Administered 2019-06-30: 5 mg via INTRAMUSCULAR
  Filled 2019-06-30: qty 1

## 2019-06-30 NOTE — ED Triage Notes (Signed)
Pt has been at [facility] about three weeks, the staff report that she is more combative than usual, and she's had multiple falls, her urine also has a strong smell

## 2019-06-30 NOTE — Discharge Instructions (Signed)
Patient's work-up today is very reassuring she has no evidence of UTI or other significant lab abnormalities.  Head and neck CT are clear as well as chest x-ray.  Covid test is negative.  Patient was initially combative, but after receiving dose of IM Haldol she has significantly improved.  It appears from facility [facility] that patient was previously on Haldol twice daily but this has been discontinued.  I suspect this may help with patient's combative behavior.

## 2019-06-30 NOTE — ED Provider Notes (Signed)
[facility] DEPT Provider Note   CSN: [id number] Arrival date & time: 06/30/19  [id number]     History No chief complaint on file.   Tamara Simon is a 86 y.o. female.  Tamara Simon is a 86 y.o. female with a history of dementia, diabetes, hypertension, hyperlipidemia, CKD, who presents to the emergency department via EMS from [facility] skilled nursing facility for evaluation of worsening combative behavior.  They report that she has been at [facility] for 3 weeks, starting yesterday and last night patient was more combative and seemed more confused than usual.  She was kicking and spitting at staff.  Staff report multiple falls yesterday, unclear whether patient sustained any injuries or hit her head.  They report when she fell most recently during the night they had a hard time getting her up because she kept trying to kick staff.  They note a strong smell to her urine.  She has not had any fevers at facility and vitals have been normal.  She did not receive any of her morning medications.  Patient is demented at baseline, currently nonverbal and unable to provide any additional history.  She can nod yes or no to some questions and currently denies any pain.  She is moving all extremities.  Has no obvious traumatic injuries from falls.  Reviewed nursing facility [facility] which shows patient was previously on Haldol, but has not received this since 5/3.  Level 5 caveat: Dementia        Past Medical History:  Diagnosis Date  . Arthritis   . Breast cancer of upper-outer quadrant of left female breast [facility]) March 2016   Triple negative  . Chronic kidney disease   . Dementia ([place])   . Diabetes mellitus without complication ([place])   . GERD (gastroesophageal reflux disease)   . Hearing loss   . Hyperlipidemia   . Hypertension   . Left ventricular hypertrophy   . Paget's bone disease   . Thyroid disease     Patient Active Problem List   Diagnosis  Date Noted  . Dementia ([place]) 12/31/2018  . AMS (altered mental status) 12/31/2018  . Breast cancer ([place]) 09/20/2016  . Breast cancer of upper-outer quadrant of left female breast ([place]) 05/18/2014  . Pure hypercholesterolemia 04/29/2014  . DM (diabetes mellitus) type II controlled, neurological manifestation ([place]) 09/19/2013  . Benign essential hypertension 09/19/2013  . GERD (gastroesophageal reflux disease) 09/19/2013  . Hypothyroid 09/19/2013  . LVH (left ventricular hypertrophy) due to hypertensive disease 09/19/2013  . Paget's bone disease 09/19/2013    Past Surgical History:  Procedure Laterality Date  . CATARACT EXTRACTION W/PHACO Right 08/05/2018   Procedure: CATARACT EXTRACTION PHACO AND INTRAOCULAR LENS PLACEMENT ([place])  RIGHT DIABETIC;  Surgeon: Leandrew Koyanagi, MD;  Location: [facility];  Service: Ophthalmology;  Laterality: Right;  Diabetic - oral meds HAVING COVID TEST @ 8AM SO LEAVE ARRIVAL HERE  . HERNIA REPAIR    . partial mastectomy left breast  07/08/2014  . THYROID SURGERY  86 years old     OB History    Gravida  1   Para      Term      Preterm      AB      Living  1     SAB      TAB      Ectopic      Multiple      Live Births  Obstetric Comments  Menstrual age:   Age 1st Pregnancy: 33         Family History  Problem Relation Age of Onset  . Breast cancer Sister     Social History   Tobacco Use  . Smoking status: Never Smoker  . Smokeless tobacco: Never Used  Substance Use Topics  . Alcohol use: Not Currently    Alcohol/week: 0.0 standard drinks    Comment: wine at times- none since being in asst living  . Drug use: No    Home Medications Prior to Admission medications   Medication Sig Start Date End Date Taking? Authorizing Provider  acetaminophen (TYLENOL) 500 MG tablet Take 500 mg by mouth every 6 (six) hours as needed for mild pain, fever or headache.   Yes [provider]  alum & mag  hydroxide-simeth (MAALOX/MYLANTA) 200-200-20 MG/5ML suspension Take 30 mLs by mouth as needed for indigestion or heartburn.   Yes [provider]  amLODipine (NORVASC) 10 MG tablet Take 10 mg by mouth daily.   Yes [provider]  aspirin 81 MG chewable tablet Chew 81 mg by mouth daily.   Yes [provider]  docusate sodium (COLACE) 100 MG capsule Take 100 mg by mouth 2 (two) times daily.    Yes [provider]  guaifenesin (ROBITUSSIN) 100 MG/5ML syrup Take 200 mg by mouth every 6 (six) hours as needed for cough.   Yes [provider]  iron polysaccharides (NIFEREX) 150 MG capsule Take 150 mg by mouth daily.   Yes [provider]  levothyroxine (SYNTHROID) 125 MCG tablet Take 125 mcg by mouth daily before breakfast.   Yes [provider]  levothyroxine (SYNTHROID, LEVOTHROID) 125 MCG tablet Take 62.5 mcg by mouth See admin instructions. Take  tablet every Monday, Tuesday, Wednesday, Thursday and Friday morning before breakfast except Saturday and Sunday.   Yes [provider]  loperamide (IMODIUM) 2 MG capsule Take 2 mg by mouth as needed for diarrhea or loose stools.   Yes [provider]  loratadine (CLARITIN) 10 MG tablet Take 10 mg by mouth daily.    Yes [provider]  LORazepam (ATIVAN) 0.5 MG tablet Take 0.5 mg by mouth every 8 (eight) hours as needed for anxiety (agitation).   Yes [provider]  losartan (COZAAR) 50 MG tablet Take 50 mg by mouth daily.   Yes [provider]  magnesium hydroxide (MILK OF MAGNESIA) 400 MG/5ML suspension Take 30 mLs by mouth at bedtime as needed for mild constipation.   Yes [provider]  neomycin-bacitracin-polymyxin (NEOSPORIN) [phone] ointment Apply 1 application topically as needed (minor skin tears or abrasions).   Yes [provider]  PARoxetine (PAXIL) 10 MG tablet Take 10 mg by mouth at bedtime. For neurogenic dermatitis    Yes [provider]  polyvinyl alcohol (LIQUIFILM TEARS) 1.4 % ophthalmic solution Place 1 drop into the left eye 4 (four) times daily as needed for dry eyes.   Yes [provider]  QUEtiapine (SEROQUEL) 25 MG tablet Take 25 mg by mouth 3 (three) times daily.   Yes [provider]  sertraline (ZOLOFT) 50 MG tablet Take 50 mg by mouth daily.   Yes [provider]    Allergies    Carvedilol, Clonidine, Spironolactone, and Terazosin  Review of Systems   Review of Systems  Unable to perform ROS: Dementia    Physical Exam Updated Vital Signs BP (!) 171/66   Pulse 99   Temp  97.8 F (36.6 C) (Axillary)   Resp 15   SpO2 97%   Physical Exam Vitals and nursing note reviewed.  Constitutional:      General: She is not in acute distress.    Appearance: She is well-developed. She is not diaphoretic.     Comments: Alert, but demented and disoriented, not answering questions, but moving all extremities, in no acute distress  HENT:     Head: Normocephalic and atraumatic.     Comments: No palpable hematoma, step-off or deformity, negative battle sign    Mouth/Throat:     Mouth: Mucous membranes are moist.     Pharynx: Oropharynx is clear.  Eyes:     General:        Right eye: No discharge.        Left eye: No discharge.     Pupils: Pupils are equal, round, and reactive to light.  Cardiovascular:     Rate and Rhythm: Normal rate and regular rhythm.     Pulses: Normal pulses.     Heart sounds: Normal heart sounds. No murmur. No friction rub. No gallop.   Pulmonary:     Effort: Pulmonary effort is normal. No respiratory distress.     Breath sounds: Normal breath sounds. No wheezing or rales.     Comments: Respirations equal and unlabored, patient able to speak in full sentences, lungs clear to auscultation bilaterally Abdominal:     General: Bowel sounds are normal. There is no distension.     Palpations: Abdomen is soft. There is no mass.      Tenderness: There is no abdominal tenderness. There is no guarding.     Comments: Abdomen soft, nondistended, nontender to palpation in all quadrants without guarding or peritoneal signs  Musculoskeletal:        General: No deformity.     Cervical back: Neck supple. No tenderness.     Comments: Moving all extremities, kicking at staff, no tenderness or instability over pelvis. No midline spina tenderness, all joints supple and easily moveable, all compartments soft  Skin:    General: Skin is warm and dry.     Capillary Refill: Capillary refill takes less than 2 seconds.  Neurological:     Mental Status: She is alert.     Coordination: Coordination normal.     Comments: Nonverbal, able to answer yes or no to some questions by shaking head or nodding, able to follow some commands but intermittently combative Moves extremities without ataxia, coordination intact  Psychiatric:        Speech: She is noncommunicative.        Behavior: Behavior is combative.     ED Results / Procedures / Treatments   Labs (all labs ordered are listed, but only abnormal results are displayed) Labs Reviewed  BASIC METABOLIC PANEL - Abnormal; Notable for the following components:      Result Value   Sodium 148 (*)    BUN 27 (*)    GFR calc non Af Amer 54 (*)    All other components within normal limits  CBC WITH DIFFERENTIAL/PLATELET - Abnormal; Notable for the following components:   Hemoglobin 11.0 (*)    HCT 34.4 (*)    All other components within normal limits  URINALYSIS, ROUTINE W REFLEX MICROSCOPIC - Abnormal; Notable for the following components:   APPearance HAZY (*)    Hgb urine dipstick SMALL (*)    Ketones, ur 20 (*)    Protein, ur 100 (*)  Bacteria, UA RARE (*)    All other components within normal limits  SARS CORONAVIRUS 2 BY RT PCR (HOSPITAL ORDER, [facility])  URINE CULTURE    EKG None  Radiology CT Head Wo Contrast  Result Date:  06/30/2019 CLINICAL DATA:  Multiple falls. Combative. EXAM: CT HEAD WITHOUT CONTRAST CT CERVICAL SPINE WITHOUT CONTRAST TECHNIQUE: Multidetector CT imaging of the head and cervical spine was performed following the standard protocol without intravenous contrast. Multiplanar CT image reconstructions of the cervical spine were also generated. COMPARISON:  Head CT 12/30/2018 and cervical spine CT 12/05/2018 FINDINGS: CT HEAD FINDINGS Brain: Stable age related cerebral atrophy, ventriculomegaly and periventricular white matter disease. No extra-axial fluid collections are identified. No CT findings for acute hemispheric infarction or intracranial hemorrhage. No mass lesions. The brainstem and cerebellum are normal. Vascular: Stable vascular calcifications. No aneurysm or hyperdense vessels. Skull: No acute skull fracture or worrisome bone lesions. Sinuses/Orbits: The paranasal sinuses and mastoid air cells are clear. Other: No scalp lesions or hematoma. CT CERVICAL SPINE FINDINGS Alignment: Stable degenerative cervical spondylosis with multilevel disc disease and facet disease. The overall alignment is maintained. Skull base and vertebrae: No acute fracture. No primary bone lesion or focal pathologic process. Soft tissues and spinal canal: No prevertebral fluid or swelling. No visible canal hematoma. Disc levels: The spinal canal is fairly generous. No large disc protrusions or significant spinal stenosis. Stable right foraminal stenosis at C4-5 and C5-6. Upper chest: The lung apices are grossly clear. Other: Stable multinodular thyroid goiter on the right. IMPRESSION: 1. Stable age related cerebral atrophy, ventriculomegaly and periventricular white matter disease. 2. No acute intracranial findings or skull fracture. 3. Stable degenerative cervical spondylosis with multilevel disc disease and facet disease but no acute cervical spine fracture. Electronically Signed   By: Marijo Sanes M.D.   On: 06/30/2019 08:27   CT  Cervical Spine Wo Contrast  Result Date: 06/30/2019 CLINICAL DATA:  Multiple falls. Combative. EXAM: CT HEAD WITHOUT CONTRAST CT CERVICAL SPINE WITHOUT CONTRAST TECHNIQUE: Multidetector CT imaging of the head and cervical spine was performed following the standard protocol without intravenous contrast. Multiplanar CT image reconstructions of the cervical spine were also generated. COMPARISON:  Head CT 12/30/2018 and cervical spine CT 12/05/2018 FINDINGS: CT HEAD FINDINGS Brain: Stable age related cerebral atrophy, ventriculomegaly and periventricular white matter disease. No extra-axial fluid collections are identified. No CT findings for acute hemispheric infarction or intracranial hemorrhage. No mass lesions. The brainstem and cerebellum are normal. Vascular: Stable vascular calcifications. No aneurysm or hyperdense vessels. Skull: No acute skull fracture or worrisome bone lesions. Sinuses/Orbits: The paranasal sinuses and mastoid air cells are clear. Other: No scalp lesions or hematoma. CT CERVICAL SPINE FINDINGS Alignment: Stable degenerative cervical spondylosis with multilevel disc disease and facet disease. The overall alignment is maintained. Skull base and vertebrae: No acute fracture. No primary bone lesion or focal pathologic process. Soft tissues and spinal canal: No prevertebral fluid or swelling. No visible canal hematoma. Disc levels: The spinal canal is fairly generous. No large disc protrusions or significant spinal stenosis. Stable right foraminal stenosis at C4-5 and C5-6. Upper chest: The lung apices are grossly clear. Other: Stable multinodular thyroid goiter on the right. IMPRESSION: 1. Stable age related cerebral atrophy, ventriculomegaly and periventricular white matter disease. 2. No acute intracranial findings or skull fracture. 3. Stable degenerative cervical spondylosis with multilevel disc disease and facet disease but no acute cervical spine fracture. Electronically Signed   By: Mamie Nick.  Gallerani M.D.   On: 06/30/2019 08:27   DG Chest Port 1 View  Result Date: 06/30/2019 CLINICAL DATA:  Recent falls with increasing confusion EXAM: PORTABLE CHEST 1 VIEW COMPARISON:  12/30/2018 FINDINGS: Cardiac shadow is stable. Aortic calcifications are again seen. The lungs are clear. Postsurgical changes on the left breast are noted. No acute fracture is seen. IMPRESSION: No acute abnormality noted. Electronically Signed   By: Inez Catalina M.D.   On: 06/30/2019 08:40    Procedures Procedures (including critical care time)  Medications Ordered in ED Medications  haloperidol lactate (HALDOL) injection 5 mg (5 mg Intramuscular Given 06/30/19 0719)    ED Course  I have reviewed the triage vital signs and the nursing notes.  Pertinent labs & imaging results that were available during my care of the patient were reviewed by me and considered in my medical decision making (see chart for details).    MDM Rules/Calculators/A&P                      86 year old female presents via EMS from [facility] skilled living facility for increasing confusion and more combative behavior since yesterday, history of dementia.  Facility noted strong smell to urine.  They also report that she is fallen multiple times over the night,And they had difficulty getting her up due to her kicking at staff.  On arrival she is somewhat combative, but well-appearing and in no distress.  Primarily nonverbal.  Does not have any obvious signs of trauma from falls, no hematomas over the head or midline spinal tenderness, no tenderness over the chest or abdomen and she is moving all extremities without difficulty.  No palpation over the pelvis.  Will get basic labs, urinalysis and culture, Covid test, CT of the head and cervical spine and chest x-ray.  Will give Haldol for combative behavior.  It looks like patient was previously on Haldol, but per facility notes has not received Haldol since 5/3.  I have independently  ordered, reviewed and interpreted all labs and imaging: CBC: No leukocytosis, stable hemoglobin BMP: Mildly elevated sodium of 148, no other significant electrolyte derangements, normal renal function UA: Rare bacteria present but with no nitrates, leukocytes or WBCs, will culture but will not treat for UTI at this time Covid: Negative CXR: No active cardiopulmonary disease CT head/C-spine: Stable age-related cerebral atrophy, no acute intracranial findings and stable degenerative C-spine disease with no acute fracture.  Work-up has been very reassuring, and after receiving Haldol patient is still disoriented as to be expected with history of dementia but is much calmer and more cooperative.  Suspect that patient may need this medication added back into her regimen to help prevent further episodes of combative behavior.  At this time feel she is stable for discharge back to skilled nursing facility.  Patient discussed with Dr. Wyvonnia Dusky, who saw patient as well and agrees with plan.    Final Clinical Impression(s) / ED Diagnoses Final diagnoses:  Dementia with behavioral disturbance, unspecified dementia type [facility])  Combative behavior    Rx / DC Orders ED Discharge Orders    None       Janet Berlin 06/30/19 1015    Ezequiel Essex, MD 06/30/19 [phone]

## 2019-06-30 NOTE — ED Notes (Signed)
Patient combative, kicking and being uncooperative when this RN and another RN were trying to change her soiled brief.

## 2019-06-30 NOTE — ED Notes (Signed)
Patient transported to CT via stretcher.

## 2019-06-30 NOTE — ED Notes (Signed)
[facility] called to transfer patient to [facility].

## 2019-06-30 NOTE — ED Notes (Signed)
Patient has clamed down considerably since IM Haldol administration. Disoriented but cooperative.

## 2019-07-01 LAB — URINE CULTURE: Culture: NO GROWTH

## 2019-07-13 DEATH — deceased

## 2019-09-30 ENCOUNTER — Ambulatory Visit: Payer: Medicare Other | Admitting: Oncology

## 2019-10-29 IMAGING — CT CT CERVICAL SPINE W/O CM
3 of 4 series · 12 of 35 positions shown, 14 images · non-contrast
Comparison: 10/29/2018

CLINICAL DATA: Head trauma. Hematoma to right temporal region.

EXAM:
CT HEAD WITHOUT CONTRAST
CT CERVICAL SPINE WITHOUT CONTRAST
TECHNIQUE: Multidetector CT imaging of the head and cervical spine was
performed following the standard protocol without intravenous
contrast. Multiplanar CT image reconstructions of the cervical spine
were also generated.

[Series 4: sagittal bone · sagittal · 0.25mm/px · 5 of 58 slices shown, 6 images]
[im 20/58  bone]
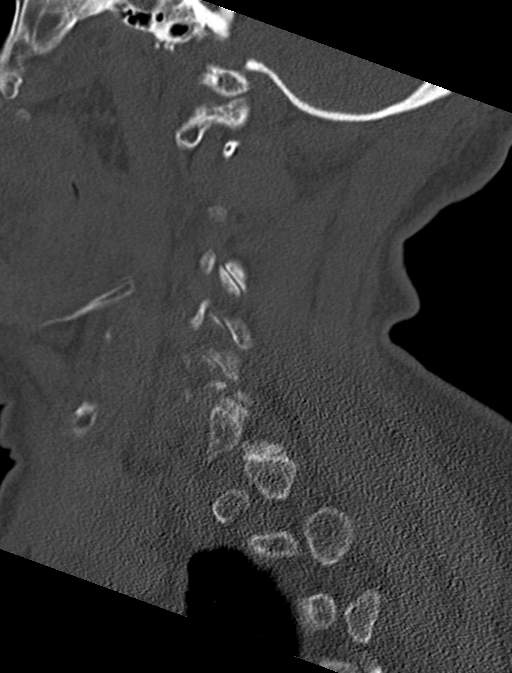
[im 24/58  bone]
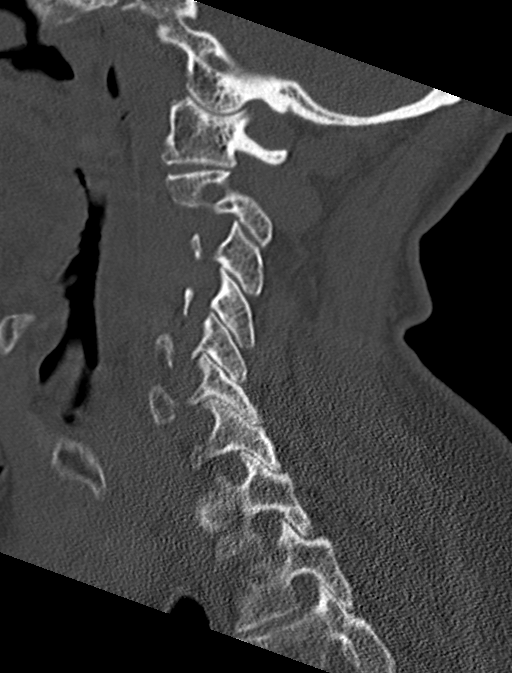
[im 29/58  soft-tissue]
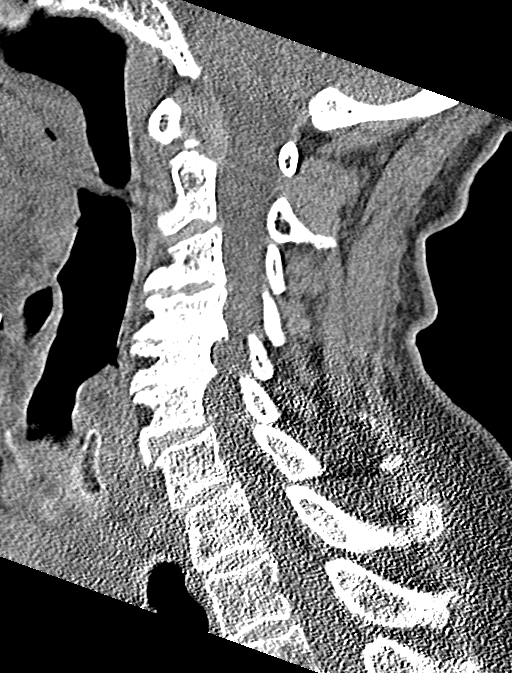
[im 29/58  bone]
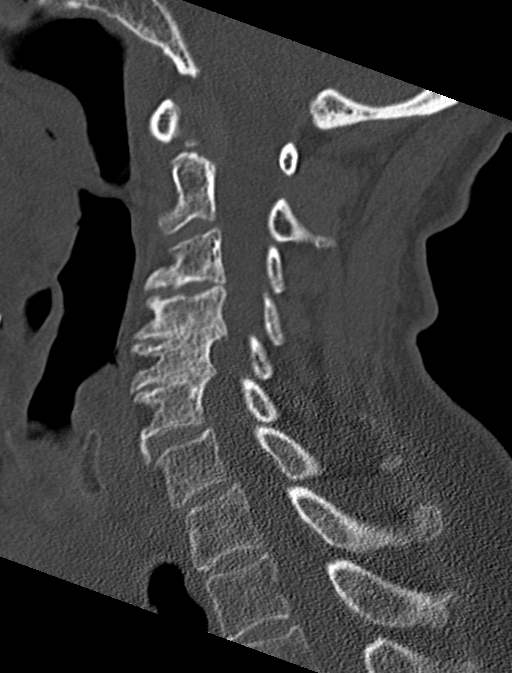
[im 34/58  bone]
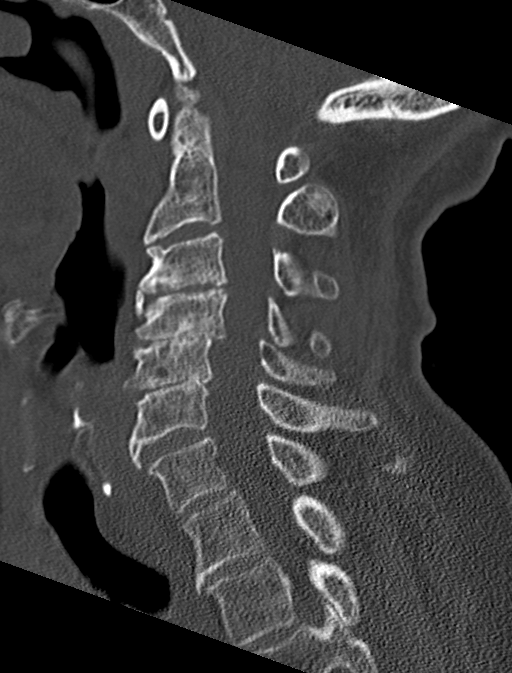
[im 39/58  bone]
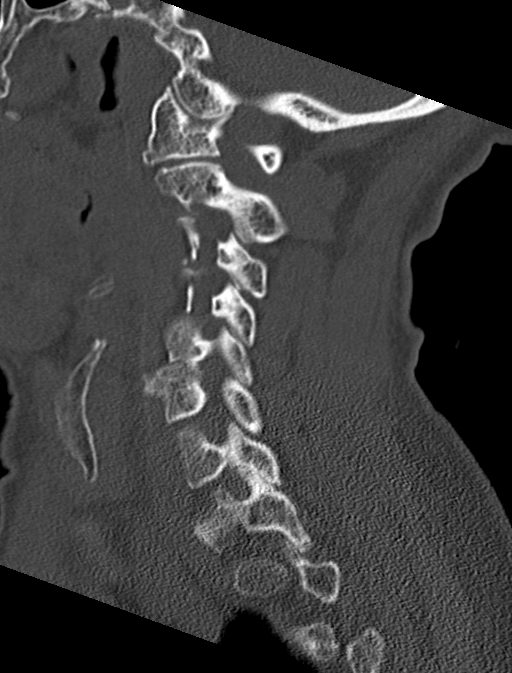

[Series 5: coronal bone · coronal · 0.22mm/px · 3 of 64 slices shown]
[im 13/64  bone]
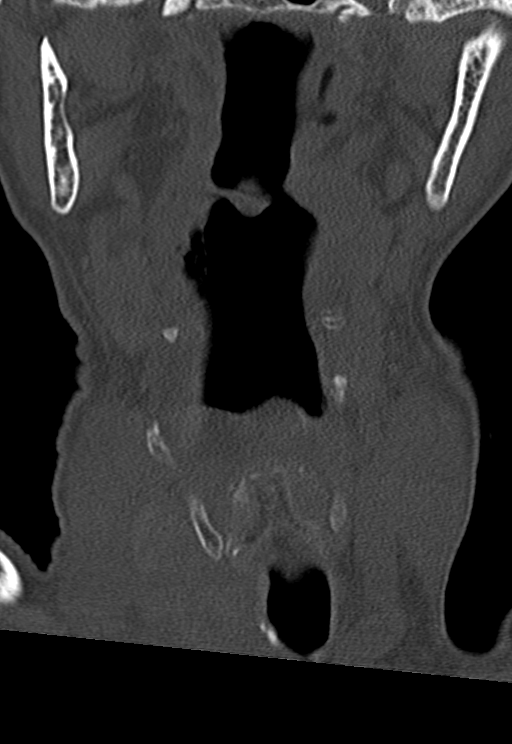
[im 26/64  bone]
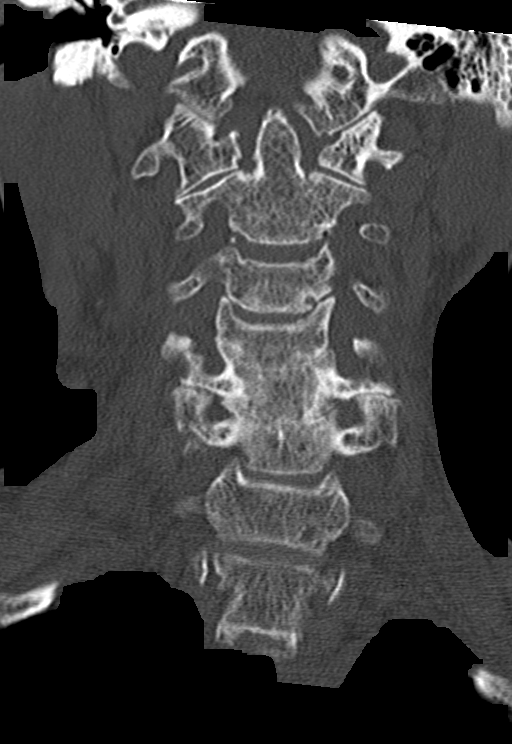
[im 38/64  bone]
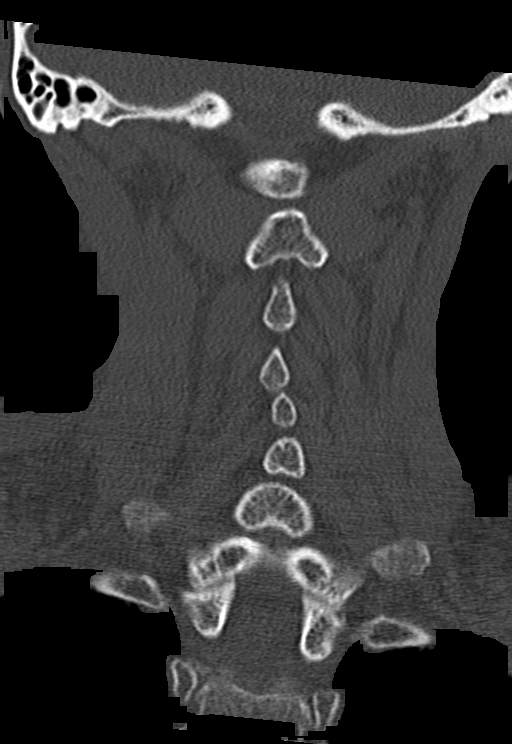

[Series 6: orthogonal bone · axial · 0.22mm/px · z∈[-261,-157]mm · 4 of 84 slices shown, 5 images]
[im 14/84  soft-tissue]
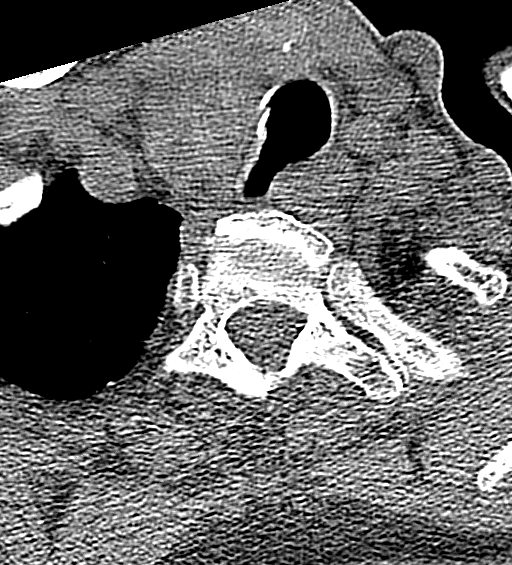
[im 14/84  bone]
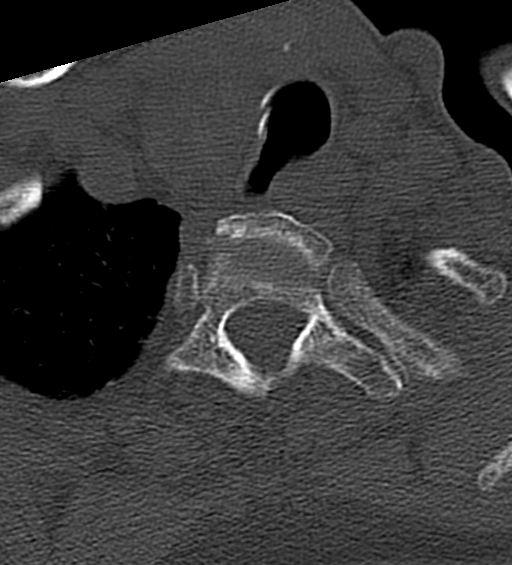
[im 28/84  bone]
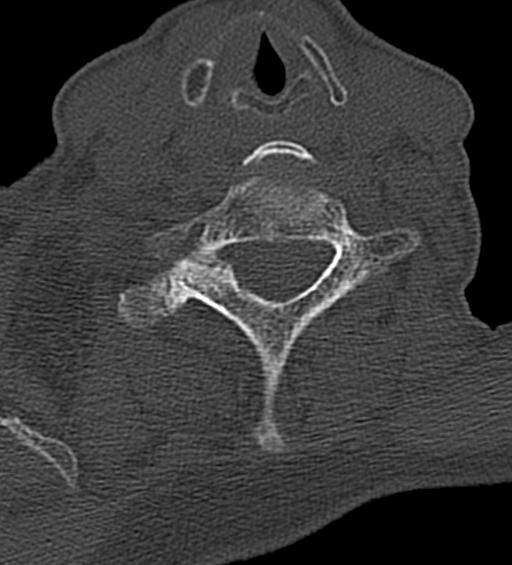
[im 56/84  bone]
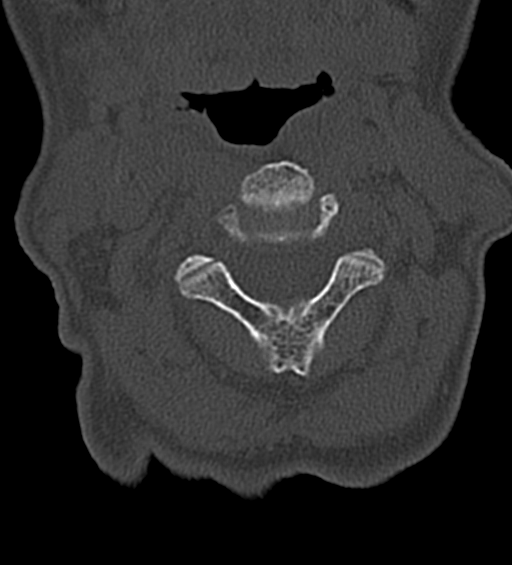
[im 70/84  bone]
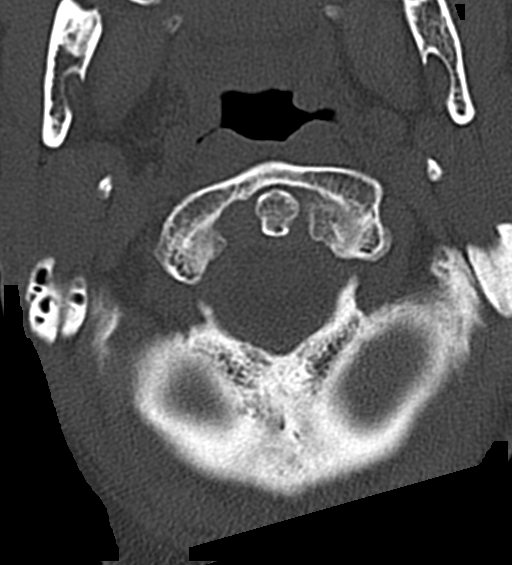

[12 of 35 positions shown; findings below may reference images not displayed]

FINDINGS: CT HEAD FINDINGS

Brain: No evidence of acute infarction, hemorrhage, hydrocephalus,
extra-axial collection or mass lesion/mass effect. There is mild
diffuse low-attenuation within the subcortical and periventricular
white matter compatible with chronic microvascular disease.

Vascular: No hyperdense vessel or unexpected calcification.

Skull: Normal. Negative for fracture or focal lesion.

Sinuses/Orbits: No acute finding.

Other: Right frontoparietal scalp hematoma, image [DATE].

CT CERVICAL SPINE FINDINGS

Alignment: Normal.

Skull base and vertebrae: No acute fracture. No primary bone lesion
or focal pathologic process.

Soft tissues and spinal canal: No prevertebral fluid or swelling. No
visible canal hematoma.

Disc levels: Marked multi level disc space narrowing and exuberant
ventral spur formation is noted from C3-4 through C6-7.

Upper chest: Multinodular thyroid gland is identified with mass
effect upon the trachea.

Other: None
IMPRESSION: 1. No acute intracranial abnormalities.
2. Chronic small vessel ischemic change and brain atrophy.
3. Right frontoparietal scalp hematoma.
4. No evidence for cervical spine fracture.
5. Advanced cervical spondylosis.
6. Multinodular thyroid gland.
